# Patient Record
Sex: Male | Born: 1960
Health system: Southern US, Community
[De-identification: ages and names within clinical notes are randomized; demographics above are authoritative.]

## PROBLEM LIST (undated history)

## (undated) DIAGNOSIS — G4733 Obstructive sleep apnea (adult) (pediatric): Secondary | ICD-10-CM

## (undated) DIAGNOSIS — N529 Male erectile dysfunction, unspecified: Secondary | ICD-10-CM

## (undated) DIAGNOSIS — D751 Secondary polycythemia: Secondary | ICD-10-CM

## (undated) DIAGNOSIS — F17200 Nicotine dependence, unspecified, uncomplicated: Secondary | ICD-10-CM

## (undated) DIAGNOSIS — I517 Cardiomegaly: Secondary | ICD-10-CM

## (undated) DIAGNOSIS — Z87442 Personal history of urinary calculi: Secondary | ICD-10-CM

## (undated) DIAGNOSIS — E785 Hyperlipidemia, unspecified: Secondary | ICD-10-CM

## (undated) DIAGNOSIS — I10 Essential (primary) hypertension: Secondary | ICD-10-CM

## (undated) DIAGNOSIS — E669 Obesity, unspecified: Secondary | ICD-10-CM

## (undated) DIAGNOSIS — I219 Acute myocardial infarction, unspecified: Secondary | ICD-10-CM

## (undated) DIAGNOSIS — I371 Nonrheumatic pulmonary valve insufficiency: Secondary | ICD-10-CM

## (undated) DIAGNOSIS — I2510 Atherosclerotic heart disease of native coronary artery without angina pectoris: Secondary | ICD-10-CM

## (undated) DIAGNOSIS — Z9289 Personal history of other medical treatment: Secondary | ICD-10-CM

## (undated) DIAGNOSIS — IMO0001 Reserved for inherently not codable concepts without codable children: Secondary | ICD-10-CM

## (undated) HISTORY — DX: Secondary polycythemia: D75.1

## (undated) HISTORY — DX: Hyperlipidemia, unspecified: E78.5

## (undated) HISTORY — DX: Reserved for inherently not codable concepts without codable children: IMO0001

## (undated) HISTORY — DX: Obesity, unspecified: E66.9

## (undated) HISTORY — DX: Cardiomegaly: I51.7

## (undated) HISTORY — DX: Atherosclerotic heart disease of native coronary artery without angina pectoris: I25.10

## (undated) HISTORY — PX: APPENDECTOMY: SHX54

## (undated) HISTORY — DX: Nonrheumatic pulmonary valve insufficiency: I51.7

## (undated) HISTORY — DX: Nonrheumatic pulmonary valve insufficiency: I37.1

## (undated) HISTORY — DX: Obstructive sleep apnea (adult) (pediatric): G47.33

## (undated) HISTORY — PX: OTHER SURGICAL HISTORY: SHX169

## (undated) HISTORY — DX: Nicotine dependence, unspecified, uncomplicated: F17.200

## (undated) HISTORY — DX: Personal history of other medical treatment: Z92.89

---

## 2007-04-26 HISTORY — PX: PERCUTANEOUS CORONARY STENT INTERVENTION (PCI-S): SHX6016

## 2007-05-09 DIAGNOSIS — I2510 Atherosclerotic heart disease of native coronary artery without angina pectoris: Secondary | ICD-10-CM

## 2007-05-09 DIAGNOSIS — I214 Non-ST elevation (NSTEMI) myocardial infarction: Secondary | ICD-10-CM

## 2007-05-09 HISTORY — PX: CORONARY THROMBECTOMY: CATH118304

## 2007-05-09 HISTORY — PX: CORONARY ANGIOPLASTY WITH STENT PLACEMENT: SHX49

## 2007-05-09 HISTORY — DX: Atherosclerotic heart disease of native coronary artery without angina pectoris: I25.10

## 2007-05-09 HISTORY — DX: Non-ST elevation (NSTEMI) myocardial infarction: I21.4

## 2011-01-03 ENCOUNTER — Encounter: Payer: Self-pay | Admitting: Cardiology

## 2011-01-03 ENCOUNTER — Ambulatory Visit (INDEPENDENT_AMBULATORY_CARE_PROVIDER_SITE_OTHER): Payer: Self-pay | Admitting: Cardiology

## 2011-01-03 DIAGNOSIS — R0989 Other specified symptoms and signs involving the circulatory and respiratory systems: Secondary | ICD-10-CM

## 2011-01-03 DIAGNOSIS — I50810 Right heart failure, unspecified: Secondary | ICD-10-CM

## 2011-01-03 DIAGNOSIS — E785 Hyperlipidemia, unspecified: Secondary | ICD-10-CM

## 2011-01-03 DIAGNOSIS — I2510 Atherosclerotic heart disease of native coronary artery without angina pectoris: Secondary | ICD-10-CM

## 2011-01-03 DIAGNOSIS — R0609 Other forms of dyspnea: Secondary | ICD-10-CM

## 2011-01-03 DIAGNOSIS — F17200 Nicotine dependence, unspecified, uncomplicated: Secondary | ICD-10-CM

## 2011-01-03 DIAGNOSIS — I509 Heart failure, unspecified: Secondary | ICD-10-CM

## 2011-01-03 DIAGNOSIS — G4730 Sleep apnea, unspecified: Secondary | ICD-10-CM

## 2011-01-03 DIAGNOSIS — G4733 Obstructive sleep apnea (adult) (pediatric): Secondary | ICD-10-CM

## 2011-01-03 MED ORDER — BUPROPION HCL ER (SR) 150 MG PO TB12: ORAL_TABLET | ORAL | Status: DC

## 2011-01-03 NOTE — Patient Instructions (Signed)
Lab today---TSH/CBC/BMP/Lipid profile/Liver profile 414.01  786.09  Schedule an appointment for an echocardiogram.  Schedule an appointment with pulmonary for management of your CPAP.  Take Aspirin 81mg  daily. This should be enteric coated.  Take Welbutrin to help you stop smoking. Take one daily for 3 days, then increase to one twice a day. You can take this for a total of 3 months.  Schedule an appointment to see Dr Shirlee Latch in 2-3 weeks.

## 2011-01-04 ENCOUNTER — Telehealth: Payer: Self-pay | Admitting: Cardiology

## 2011-01-04 DIAGNOSIS — G4733 Obstructive sleep apnea (adult) (pediatric): Secondary | ICD-10-CM | POA: Insufficient documentation

## 2011-01-04 DIAGNOSIS — F17200 Nicotine dependence, unspecified, uncomplicated: Secondary | ICD-10-CM | POA: Insufficient documentation

## 2011-01-04 DIAGNOSIS — E785 Hyperlipidemia, unspecified: Secondary | ICD-10-CM | POA: Insufficient documentation

## 2011-01-04 DIAGNOSIS — I50810 Right heart failure, unspecified: Secondary | ICD-10-CM | POA: Insufficient documentation

## 2011-01-04 DIAGNOSIS — I2510 Atherosclerotic heart disease of native coronary artery without angina pectoris: Secondary | ICD-10-CM | POA: Insufficient documentation

## 2011-01-04 NOTE — Progress Notes (Signed)
50 yo with history of CAD, OSA, and obesity presents to establish cardiology care.  He had an MI in 1/09 in [place].  At that time, he had BMS x 2 to the RCA. Last stress myoview was done as part of a DOT physical in [place] in 12/11.  He says he was told that the stress test turned out "all right."  He recently moved to [place].  Prior to this, he lived in [place] and saw a cardiologist there.  He says that 3 years ago, soon after his MI, he had an echo done that showed RV enlargement. He has since then started on CPAP.  He has not had a repeat echo since that time.  Patient is not very active in general, no regular exercise.  He notes dyspnea with steps or walking up an incline.  This has been fairly chronic.  He can walk up to a mile on flat ground. Lately, he has noted increased fatigue.  No chest pain or tightness.  No tachypalpitations.    PMH: 1. Obesity 2. OSA 3. CAD: MI in 1/09 in [place].  Had BMS x 2 to the RCA at that time.  Last stress myoview was in 12/11 in [place] (done for DOT physical) and per the patient was unremarkable.   4. Hyperlipidemia 5. Active smoker 6. Dilated RV on prior echo  SH: Lives in [place], married, works as Naval architect.  Smokes about 1 ppd.   FH: Father with MI in his 59s.   ROS: All systems reviewed and negative except as per HPI.   Current Outpatient Prescriptions  Medication Sig Dispense Refill  . clopidogrel (PLAVIX) 75 MG tablet Take 75 mg by mouth daily.        Marland Kitchen lisinopril (PRINIVIL,ZESTRIL) 10 MG tablet Take 10 mg by mouth daily.        . simvastatin (ZOCOR) 80 MG tablet Take 80 mg by mouth at bedtime.        Marland Kitchen aspirin EC 81 MG tablet Take 1 tablet (81 mg total) by mouth daily.      Marland Kitchen buPROPion (WELLBUTRIN SR) 150 MG 12 hr tablet Take one  daily for 3 days, then increase to one twice a day.  60 tablet  2    BP 134/89  Pulse 80  Resp 18  Ht 5\' 8"  (1.727 m)  Wt 289 lb (131.09 kg)  BMI 43.94 kg/m2 General: NAD, obese Neck: Thick, no  JVD, no thyromegaly or thyroid nodule.  Lungs: Clear to auscultation bilaterally with normal respiratory effort. CV: Nondisplaced PMI.  Heart regular S1/S2, no S3/S4, no murmur.  No peripheral edema.  No carotid bruit.  Normal pedal pulses.  Abdomen: Soft, nontender, no hepatosplenomegaly, no distention.  Skin: Intact without lesions or rashes.  Neurologic: Alert and oriented x 3.  Psych: Normal affect. Extremities: No clubbing or cyanosis.  HEENT: Normal.

## 2011-01-04 NOTE — Assessment & Plan Note (Signed)
Stable with no chest pain. Continue ASA (can decrease to 81 mg daily), Plavix, simvastatin, and lisinopril.  He was on atenolol in the past but it was stopped due to bradycardia.   In the future, will need to consider restarting a beta blocker.

## 2011-01-04 NOTE — Assessment & Plan Note (Addendum)
Taking simvastatin 40 mg daily.  Goal LDL < 70.  Will check lipids/LFTs.

## 2011-01-04 NOTE — Assessment & Plan Note (Signed)
Increased fatigue could be a byproduct of OSA.  Patient feels like CPAP is not working as well. Will refer to sleep medicine for further evaluation.

## 2011-01-04 NOTE — Telephone Encounter (Addendum)
ROI faxed over to [facility]/Dr.Jennifer Debarah Crape @ [phone]   01/04/11/km  Records received from Dr.Jennifer [place] Office have to [place]  01/04/11/km

## 2011-01-04 NOTE — Assessment & Plan Note (Signed)
RV enlargement per patient on prior echo > 3 years ago.  Never had a followup.  Cause certainly could have been pulmonary hypertension in the setting of OSA and smoking, ? COPD.  He has been wearing CPAP for a couple of years now. He is short of breath with exertion.  This could be due to right or left heart failure but also could be due to obesity/deconditioning.  - Echo: assess for RV size/function and PA pressure. - As above, will have him see sleep medicine for reassessment of CPAP.

## 2011-01-04 NOTE — Assessment & Plan Note (Signed)
I counselled him to quit smoking.  He will try wellbutrin as an assist.

## 2011-01-24 ENCOUNTER — Ambulatory Visit: Payer: Self-pay | Admitting: Cardiology

## 2011-01-26 ENCOUNTER — Encounter: Payer: Self-pay | Admitting: Cardiology

## 2011-01-27 ENCOUNTER — Encounter: Payer: Self-pay | Admitting: *Deleted

## 2011-01-28 ENCOUNTER — Encounter: Payer: Self-pay | Admitting: Cardiology

## 2011-01-28 ENCOUNTER — Ambulatory Visit (INDEPENDENT_AMBULATORY_CARE_PROVIDER_SITE_OTHER): Payer: PRIVATE HEALTH INSURANCE | Admitting: Cardiology

## 2011-01-28 ENCOUNTER — Ambulatory Visit (HOSPITAL_COMMUNITY): Payer: PRIVATE HEALTH INSURANCE | Attending: Cardiology | Admitting: Radiology

## 2011-01-28 DIAGNOSIS — E785 Hyperlipidemia, unspecified: Secondary | ICD-10-CM | POA: Insufficient documentation

## 2011-01-28 DIAGNOSIS — R0609 Other forms of dyspnea: Secondary | ICD-10-CM

## 2011-01-28 DIAGNOSIS — R0989 Other specified symptoms and signs involving the circulatory and respiratory systems: Secondary | ICD-10-CM

## 2011-01-28 DIAGNOSIS — F17200 Nicotine dependence, unspecified, uncomplicated: Secondary | ICD-10-CM | POA: Insufficient documentation

## 2011-01-28 DIAGNOSIS — I509 Heart failure, unspecified: Secondary | ICD-10-CM

## 2011-01-28 DIAGNOSIS — I2510 Atherosclerotic heart disease of native coronary artery without angina pectoris: Secondary | ICD-10-CM

## 2011-01-28 DIAGNOSIS — I50810 Right heart failure, unspecified: Secondary | ICD-10-CM

## 2011-01-28 DIAGNOSIS — I252 Old myocardial infarction: Secondary | ICD-10-CM | POA: Insufficient documentation

## 2011-01-28 MED ORDER — BISOPROLOL FUMARATE 5 MG PO TABS: 2.5000 mg | ORAL_TABLET | Freq: Every day | ORAL | Status: DC

## 2011-01-28 MED ORDER — SIMVASTATIN 40 MG PO TABS: 40.0000 mg | ORAL_TABLET | Freq: Every day | ORAL | Status: DC

## 2011-01-28 NOTE — Patient Instructions (Addendum)
Your physician wants you to follow-up in:6 months. You will receive a reminder letter in the mail two months in advance. If you don't receive a letter, please call our office to schedule the follow-up appointment.   Your physician has recommended you make the following change in your medication:   Add zebata 2.5 mg, daily  PLEASE SEND CHOLESTEROL RESULTS TO DR MCLEAN.  Call sleep study for repair of cpap machine at [phone]

## 2011-01-30 NOTE — Progress Notes (Signed)
50 yo with history of CAD, OSA, and obesity presents for cardiology followup.  He had an MI in 1/09 in [place].  At that time, he had BMS x 2 to the RCA. Last stress myoview was done as part of a DOT physical in [place] in 12/11.  He says he was told that the stress test turned out "all right."  He recently moved to [place].  Prior to this, he lived in [place] and saw a cardiologist there.  He says that 3 years ago, soon after his MI, he had an echo done that showed RV enlargement. He has since then started on CPAP.  He has not had a repeat echo since that time.  Patient is not very active in general, no regular exercise.  He notes dyspnea with steps or walking up an incline.  This has been fairly chronic.  He can walk up to a mile on flat ground. Lately, he has noted increased fatigue.  No chest pain or tightness.  No tachypalpitations.  He continues to smoke.    I had him get an echo today.  This showed EF 55%, basal to mid inferior hypokinesis, grade II diastolic dysfunction, normal RV, and no evidence for pulmonary hypertension.   PMH: 1. Obesity 2. OSA 3. CAD: MI in 1/09 in [place].  Had BMS x 2 to the RCA at that time.  Last stress myoview was in 12/11 in [place] (done for DOT physical) and per the patient was unremarkable.   4. Hyperlipidemia 5. Active smoker 6. Dilated RV on 2009 echo.  However, echo (10/12) showed EF 55%, basal to mid inferior hypokinesis, grade II diastolic dysfunction, normal RV size and systolic function, PA systolic pressure   SH: Lives in [place], married, works as Naval architect.  Smokes about 1 ppd.   FH: Father with MI in his 16s.   ROS: All systems reviewed and negative except as per HPI.   Current Outpatient Prescriptions  Medication Sig Dispense Refill  . aspirin EC 81 MG tablet Take 1 tablet (81 mg total) by mouth daily.      Marland Kitchen buPROPion (WELLBUTRIN SR) 150 MG 12 hr tablet Take one  daily for 3 days, then increase to one twice a day.  60 tablet  2  .  clopidogrel (PLAVIX) 75 MG tablet Take 75 mg by mouth daily.        Marland Kitchen lisinopril (PRINIVIL,ZESTRIL) 10 MG tablet Take 10 mg by mouth daily.        . simvastatin (ZOCOR) 40 MG tablet Take 1 tablet (40 mg total) by mouth at bedtime.  30 tablet  1  . bisoprolol (ZEBETA) 5 MG tablet Take 0.5 tablets (2.5 mg total) by mouth daily.  15 tablet  5    BP 132/82  Pulse 76  Ht 5\' 7"  (1.702 m)  Wt 292 lb (132.45 kg)  BMI 45.73 kg/m2 General: NAD, obese Neck: Thick, no JVD, no thyromegaly or thyroid nodule.  Lungs: Clear to auscultation bilaterally with normal respiratory effort. CV: Nondisplaced PMI.  Heart regular S1/S2, no S3/S4, no murmur.  No peripheral edema.  No carotid bruit.  Normal pedal pulses.  Abdomen: Soft, nontender, no hepatosplenomegaly, no distention.  Skin: Intact without lesions or rashes.  Neurologic: Alert and oriented x 3.  Psych: Normal affect. Extremities: No clubbing or cyanosis.  HEENT: Normal.

## 2011-01-30 NOTE — Assessment & Plan Note (Signed)
Patient had recent lipids/LFTs as part of an insurance physical.  He will send [place] the labs.

## 2011-01-30 NOTE — Assessment & Plan Note (Signed)
Stable with no chest pain. Continue ASA 81, Plavix, simvastatin, and lisinopril.  He was on atenolol in the past but it was stopped due to bradycardia.   I am going to restart a low dose beta blocker today, bisoprolol 2.5 mg daily.  I will use this rather than metoprolol give possible COPD.

## 2011-01-30 NOTE — Assessment & Plan Note (Signed)
The patient's RV was dilated and hypokinetic on report from outside hospital in 2009.  However, echo today appeared to show a normal RV.  The improvement may be due to CPAP use.  He has been having trouble with his CPAP lately.  I will refer him for a sleep medicine evaluation.

## 2011-01-30 NOTE — Assessment & Plan Note (Signed)
I counselled him to quit smoking.  He will try wellbutrin as an assist (has not filled prescription yet.

## 2011-02-17 ENCOUNTER — Telehealth: Payer: Self-pay | Admitting: *Deleted

## 2011-02-17 DIAGNOSIS — E785 Hyperlipidemia, unspecified: Secondary | ICD-10-CM

## 2011-02-17 DIAGNOSIS — I2510 Atherosclerotic heart disease of native coronary artery without angina pectoris: Secondary | ICD-10-CM

## 2011-02-17 MED ORDER — ATORVASTATIN CALCIUM 40 MG PO TABS: 40.0000 mg | ORAL_TABLET | Freq: Every day | ORAL | Status: DC

## 2011-02-17 NOTE — Telephone Encounter (Signed)
I talked with pt. He will stop simvastatin and start atorvastatin 40mg  daily. I will mail pt a order for a fasting lipid/liver profile to be done in 2 months.

## 2011-02-17 NOTE — Telephone Encounter (Signed)
Raymond Humphrey,  Mr Raymond Humphrey's LDL is too high. Needs to change to atorvastatin 40 with repeat lipids/LFTs in 2 months.

## 2011-03-09 ENCOUNTER — Encounter: Payer: Self-pay | Admitting: Cardiology

## 2012-02-06 ENCOUNTER — Encounter: Payer: Self-pay | Admitting: Internal Medicine

## 2012-02-06 ENCOUNTER — Ambulatory Visit (INDEPENDENT_AMBULATORY_CARE_PROVIDER_SITE_OTHER): Payer: BC Managed Care – PPO | Admitting: Internal Medicine

## 2012-02-06 VITALS — BP 126/80 | HR 92 | Ht 68.0 in | Wt 303.4 lb

## 2012-02-06 DIAGNOSIS — F17200 Nicotine dependence, unspecified, uncomplicated: Secondary | ICD-10-CM

## 2012-02-06 DIAGNOSIS — G4733 Obstructive sleep apnea (adult) (pediatric): Secondary | ICD-10-CM

## 2012-02-06 MED ORDER — DOXYCYCLINE HYCLATE 100 MG PO TABS: ORAL_TABLET | ORAL | Status: AC

## 2012-02-06 NOTE — Progress Notes (Signed)
02/06/12- 51 yoM smoker self referred for sleep evaluation, complicated by hx CAD/ stents, R ventricular failure, tobacco use. Self referral-Had [facility] through [place], [place]; had sleep study about 6 years ago in [place]. 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 He finds he cannot sleep without CPAP, 9 CWP. He is no longer followed by his pulmonary physician in [place]. He feels rested and is told his does not snore through his CPAP mask. Bedtime 10 PM estimating sleep latency 30 minutes and waking 3 times before up at 6 AM. He has lost 25 pounds in the last 2 years. In the past 5 days he has had acute nasal congestion with some bloody nasal discharge and mild frontal headache. There is no history ENT surgery. He is aware of deviated septum. Medical history of myocardial infarction. History of TB exposure with details not available. He had smoked as much as 2-1/2 packs per day but is now down to one half pack per day and he drinks a sixpack of beer per week when at home. He is married, working as a Warehouse manager. An uncle has sleep apnea.  Prior to Admission medications   Medication Sig Start Date End Date Taking? Authorizing Provider  aspirin EC 81 MG tablet Take 1 tablet (81 mg total) by mouth daily. 02/07/12   Laurey Morale, MD  atorvastatin (LIPITOR) 40 MG tablet Take 1 tablet (40 mg total) by mouth daily. 02/07/12 02/06/13  Laurey Morale, MD  buPROPion (WELLBUTRIN SR) 150 MG 12 hr tablet Take one  daily for 3 days, then increase to one twice a day. 02/07/12   Laurey Morale, MD  doxycycline (VIBRA-TABS) 100 MG tablet 2 today then one daily 02/06/12 02/05/13  Waymon Budge, MD  lisinopril (PRINIVIL,ZESTRIL) 10 MG tablet Take 1 tablet (10 mg total) by mouth daily. 02/07/12   Laurey Morale, MD   Past Medical History  Diagnosis Date  . RVF (right ventricular failure)   . Hyperlipidemia   . Coronary artery disease   . Smoking   . OSA (obstructive  sleep apnea)    Past Surgical History  Procedure Date  . Angioplasty   . Appendectomy    History reviewed. No pertinent family history. History   Social History  . Marital Status: Married    Spouse Name: N/A    Number of Children: N/A  . Years of Education: N/A   Occupational History  . Truck Driver [employer]   Social History Main Topics  . Smoking status: Current Every Day Smoker -- 0.5 packs/day    Types: Cigarettes  . Smokeless tobacco: Not on file  . Alcohol Use: Not on file     6pack a week when home  . Drug Use: No  . Sexually Active: Not on file   Other Topics Concern  . Not on file   Social History Narrative  . No narrative on file   ROS-see HPI Constitutional:   No-   weight loss, night sweats, fevers, chills, fatigue, lassitude. HEENT:   No-  headaches, difficulty swallowing, tooth/dental problems, sore throat,       No-  sneezing, itching, ear ache, nasal congestion, post nasal drip,  CV:  No-   chest pain, orthopnea, PND, swelling in lower extremities, anasarca,  dizziness, palpitations Resp: No-   shortness of breath with exertion or at rest.              No-  productive cough,  No non-productive cough,  No- coughing up of blood.              No-   change in color of mucus.  No- wheezing.   Skin: No-   rash or lesions. GI:  No-   heartburn, indigestion, abdominal pain, nausea, vomiting, diarrhea,                 change in bowel habits, loss of appetite GU: No-   dysuria, change in color of urine, no urgency or frequency.  No- flank pain. MS:  No-   joint pain or swelling.  No- decreased range of motion.  No- back pain. Neuro-     nothing unusual Psych:  No- change in mood or affect. No depression or anxiety.  No memory loss.  OBJ- Physical Exam General- Alert, Oriented, Affect-appropriate, Distress- none acute, + obese Skin- rash-none, lesions- none, excoriation- none Lymphadenopathy- none Head- atraumatic            Eyes- Gross vision intact,  PERRLA, conjunctivae and secretions clear            Ears- Hearing, canals-normal            Nose- + turbinate edema, no-Septal dev, mucus, polyps, erosion, perforation             Throat- Mallampati III , mucosa clear , drainage- none, tonsils- atrophic Neck- flexible , trachea midline, no stridor , thyroid nl, carotid no bruit. Thick neck. Chest - symmetrical excursion , unlabored           Heart/CV- RRR , no murmur , no gallop  , no rub, nl s1 s2                           - JVD- none , edema- none, stasis changes- none, varices- none           Lung- clear to P&A, wheeze- none, cough- none , dullness-none, rub- none           Chest wall-  Abd- tender-no, distended-no, bowel sounds-present, HSM- no Br/ Gen/ Rectal- Not done, not indicated Extrem- cyanosis- none, clubbing, none, atrophy- none, strength- nl Neuro- grossly intact to observation

## 2012-02-06 NOTE — Patient Instructions (Addendum)
Script for doxycycline sent  Consider trying the saline nasal rinse to clear congestion- squeeze bottle or Neti pot  We  Can continue your CPAP at 9 cwp/ [facility]/DME

## 2012-02-07 ENCOUNTER — Ambulatory Visit (INDEPENDENT_AMBULATORY_CARE_PROVIDER_SITE_OTHER): Payer: BC Managed Care – PPO | Admitting: Cardiology

## 2012-02-07 ENCOUNTER — Encounter: Payer: Self-pay | Admitting: Cardiology

## 2012-02-07 ENCOUNTER — Ambulatory Visit (HOSPITAL_COMMUNITY): Payer: BC Managed Care – PPO | Attending: Cardiovascular Disease | Admitting: Radiology

## 2012-02-07 VITALS — BP 124/87 | HR 84 | Ht 68.0 in | Wt 302.0 lb

## 2012-02-07 VITALS — Ht 68.0 in | Wt 304.0 lb

## 2012-02-07 DIAGNOSIS — E785 Hyperlipidemia, unspecified: Secondary | ICD-10-CM

## 2012-02-07 DIAGNOSIS — R739 Hyperglycemia, unspecified: Secondary | ICD-10-CM

## 2012-02-07 DIAGNOSIS — R0609 Other forms of dyspnea: Secondary | ICD-10-CM

## 2012-02-07 DIAGNOSIS — R0989 Other specified symptoms and signs involving the circulatory and respiratory systems: Secondary | ICD-10-CM

## 2012-02-07 DIAGNOSIS — R5381 Other malaise: Secondary | ICD-10-CM | POA: Insufficient documentation

## 2012-02-07 DIAGNOSIS — R5383 Other fatigue: Secondary | ICD-10-CM | POA: Insufficient documentation

## 2012-02-07 DIAGNOSIS — I219 Acute myocardial infarction, unspecified: Secondary | ICD-10-CM | POA: Insufficient documentation

## 2012-02-07 DIAGNOSIS — I2510 Atherosclerotic heart disease of native coronary artery without angina pectoris: Secondary | ICD-10-CM

## 2012-02-07 DIAGNOSIS — R7309 Other abnormal glucose: Secondary | ICD-10-CM

## 2012-02-07 DIAGNOSIS — R002 Palpitations: Secondary | ICD-10-CM | POA: Insufficient documentation

## 2012-02-07 DIAGNOSIS — F17200 Nicotine dependence, unspecified, uncomplicated: Secondary | ICD-10-CM

## 2012-02-07 MED ORDER — TECHNETIUM TC 99M SESTAMIBI GENERIC - CARDIOLITE
33.0000 | Freq: Once | INTRAVENOUS | Status: AC | PRN
  Administered 2012-02-07: 33 via INTRAVENOUS

## 2012-02-07 MED ORDER — ATORVASTATIN CALCIUM 40 MG PO TABS: 40.0000 mg | ORAL_TABLET | Freq: Every day | ORAL | Status: DC

## 2012-02-07 MED ORDER — LISINOPRIL 10 MG PO TABS: 10.0000 mg | ORAL_TABLET | Freq: Every day | ORAL | Status: DC

## 2012-02-07 MED ORDER — BUPROPION HCL ER (SR) 150 MG PO TB12: ORAL_TABLET | ORAL | Status: DC

## 2012-02-07 NOTE — Progress Notes (Signed)
Patient ID: Raymond Humphrey, male   DOB: 06/27/1960, 51 y.o.   MRN: [id number] 51 yo with history of CAD, OSA, and obesity presents for cardiology followup.  He had an MI in 1/09 in [place].  At that time, he had BMS x 2 to the RCA. Last stress myoview was done as part of a DOT physical in [place] in 12/11.  He says he was told that the stress test turned out "all right."  Echo in our office in 2013 showed EF 55%, basal to mid inferior hypokinesis, grade II diastolic dysfunction, normal RV, and no evidence for pulmonary hypertension.   Since I last saw Raymond Humphrey, he has gained about 10 lbs.  He is eating poorly.  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  He is smoking but has cut back some.  Now he is smoking about a pack a week.  The only medication that he is taking regularly now is aspirin.  He takes his other meds sporadically if at all.  He is not as short of breath with exertion since cutting back on cigarettes, though he does get short of breath if he walks fast up a flight of steps.  Lately, he has been getting pain down his left arm.  This is not clearly exertional and does not have a definite trigger.  However, he is concerned because he had this type of pain (and no chest pain) with his MI.   Labs (10/12): K 4.3, creatinine 1.03, LFTs normal, LDL 132, HDL 53  ECG: NSR, nonspecific T wave inversions inferiorly  PMH: 1. Obesity 2. OSA: on CPAP.  3. CAD: MI in 1/09 in [place].  Had BMS x 2 to the RCA at that time.  Last stress myoview was in 12/11 in [place] (done for DOT physical) and per the patient was unremarkable.   4. Hyperlipidemia 5. Active smoker 6. Dilated RV on 2009 echo.  However, echo (10/12) showed EF 55%, basal to mid inferior hypokinesis, grade II diastolic dysfunction, normal RV size and systolic function, PA systolic pressure   SH: Lives in [place], married, works as Naval architect.  Smokes about 1 ppd.   FH: Father with MI in his 62s.     ROS: All systems reviewed and negative except as per HPI.   Current Outpatient Prescriptions  Medication Sig Dispense Refill  . aspirin EC 81 MG tablet Take 1 tablet (81 mg total) by mouth daily.      Marland Kitchen atorvastatin (LIPITOR) 40 MG tablet Take 1 tablet (40 mg total) by mouth daily.  30 tablet  6  . buPROPion (WELLBUTRIN SR) 150 MG 12 hr tablet Take one  daily for 3 days, then increase to one twice a day.  60 tablet  2  . doxycycline (VIBRA-TABS) 100 MG tablet 2 today then one daily  8 tablet  0  . lisinopril (PRINIVIL,ZESTRIL) 10 MG tablet Take 1 tablet (10 mg total) by mouth daily.  30 tablet  6  . DISCONTD: aspirin EC 81 MG tablet Take 1 tablet (81 mg total) by mouth daily.      Marland Kitchen DISCONTD: atorvastatin (LIPITOR) 40 MG tablet Take 1 tablet (40 mg total) by mouth daily.  30 tablet  6  . DISCONTD: buPROPion (WELLBUTRIN SR) 150 MG 12 hr tablet Take one  daily for 3 days, then increase to one twice a day.  60 tablet  2  . DISCONTD: lisinopril (PRINIVIL,ZESTRIL) 10 MG tablet Take 10  mg by mouth daily.          BP 124/87  Pulse 84  Ht 5\' 8"  (1.727 m)  Wt 302 lb (136.986 kg)  BMI 45.92 kg/m2 General: NAD, obese Neck: Thick, no JVD, no thyromegaly or thyroid nodule.  Lungs: Clear to auscultation bilaterally with normal respiratory effort. CV: Nondisplaced PMI.  Heart regular S1/S2, no S3/S4, no murmur.  No peripheral edema.  No carotid bruit.  Normal pedal pulses.  Abdomen: Soft, nontender, no hepatosplenomegaly, no distention.  Neurologic: Alert and oriented x 3.  Psych: Normal affect. Extremities: No clubbing or cyanosis.   Assessment/Plan:  1. CAD: Patient is having left arm pain reminiscent of the pain with prior MI.  It is not clearly exertional, no definite pattern.  Unfortunately, he still smokes and has not been taking any of his medications regularly other than aspirin.  - I will arrange for ETT-myoview to assess for ischemia.  - He needs to continue ASA 81 mg daily.  He  should restart atorvastatin 40 mg daily and lisinopril 10 mg daily.  I will simplify his regimen by not having him restart Plavix and bisoprolol for now.  2. Hyperlipidemia: He is fasting today, will check lipids.  As above, I am going to have him restart atorvastatin and will recheck levels in 2 months along with LFTs.  3. Smoking: I strongly encouraged him to quit smoking.  He has cut back.  He will restart wellbutrin and will continue using an electronic cigarette.  4. Obesity: He has a very bad diet, eats fast food a lot when driving.  I encouraged him to pack meals to avoid eating out.  He needs to cut back on meats and processed foods and increase fruits/vegetables.  If Myoview looks ok, I want him to start exercising: work up to 30 minutes aerobic exercise like walking 5-6 days a week.  I am also going to check hemoglobin A1c.   Marca Ancona 02/07/2012 12:46 PM

## 2012-02-07 NOTE — Patient Instructions (Addendum)
Take aspirin 81mg  daily. Take this with food.   Take lisinopril 10mg  daily.  Take atorvastatin 40mg  daily.  Use welbutrin 150mg   to help you stop smoking. Take 1  daily for 3 days, then increase to 1 two times a day. Stay on this dose. You can take this for a total of 3 months.   Your physician recommends that you have  lab work today--BMET/Lipid/Liver /HGB A1c/ CBCd.   Your physician has requested that you have en exercise stress myoview. For further information please visit [email or web address] Please follow instruction sheet, as given.  Your physician wants you to follow-up in: 6 months with Dr Shirlee Latch. (April 2014). You will receive a reminder letter in the mail two months in advance. If you don't receive a letter, please call our office to schedule the follow-up appointment.

## 2012-02-12 ENCOUNTER — Encounter: Payer: Self-pay | Admitting: Internal Medicine

## 2012-02-12 NOTE — Assessment & Plan Note (Signed)
Smoking cessation was emphasized and available support reviewed.

## 2012-02-12 NOTE — Assessment & Plan Note (Signed)
He describes very good compliance and control. We discussed maintenance of equipment, how to tell if pressure needs changing, medical goals.

## 2012-02-13 ENCOUNTER — Ambulatory Visit (HOSPITAL_COMMUNITY): Payer: BC Managed Care – PPO | Attending: Internal Medicine

## 2012-02-13 DIAGNOSIS — R079 Chest pain, unspecified: Secondary | ICD-10-CM

## 2012-02-13 DIAGNOSIS — I2510 Atherosclerotic heart disease of native coronary artery without angina pectoris: Secondary | ICD-10-CM

## 2012-02-13 DIAGNOSIS — R0602 Shortness of breath: Secondary | ICD-10-CM

## 2012-02-13 MED ORDER — TECHNETIUM TC 99M SESTAMIBI GENERIC - CARDIOLITE
33.0000 | Freq: Once | INTRAVENOUS | Status: AC | PRN
  Administered 2012-02-13: 33 via INTRAVENOUS

## 2012-02-13 NOTE — Progress Notes (Addendum)
[facility] SITE 3 NUCLEAR MED [address] [id number] [place] [address] [phone]  Cardiology Nuclear Med Study  Raymond Humphrey is a 51 y.o. male     MRN : [id number]     DOB: 1960-07-03  Procedure Date: 02/13/2012  Nuclear Med Background Indication for Stress Test:  Evaluation for Ischemia, Stent Patency and Abnormal EKG History:  '09 myocardial infarction> Cath ([place])EF=55%> Stent RCA, 03/2010 Myocardial Perfusion Study-Normal per patient [facility]), EF=65%, 01/2011 Echo: EF=55% Cardiac Risk Factors: Family History - CAD, Lipids, Obesity and Smoker  Symptoms: Chest Pain with/without exertion (last occurrence several months ago), (L) upper arm pain similar to pain prior to MI (2 weeks ago),  DOE, Fatigue with Exertion, Palpitations and Rapid HR   Nuclear Pre-Procedure Caffeine/Decaff Intake:  None > 12 hrs NPO After: 11:30pm   Lungs:  clear O2 Sat: 95% on room air. IV 0.9% NS with Angio Cath:  22g  IV Site: R Wrist x 1, tolerated well IV Started by:  Irean Hong, RN  Chest Size (in):  56 Cup Size: n/a  Height: 5\' 8"  (1.727 m)  Weight:  304 lb (137.893 kg)  BMI:  Body mass index is 46.22 kg/(m^2). Tech Comments:  Took lisinopril last night    Nuclear Med Study 1 or 2 day study: 2 day  Stress Test Type:  Stress  Reading MD: Dietrich Pates, MD  Order Authorizing Provider:  Marca Ancona, MD  Resting Radionuclide: Technetium 54m Sestamibi  Resting Radionuclide Dose: 33.0 mCi    On     02-07-12  Stress Radionuclide:  Technetium 63m Sestamibi  Stress Radionuclide Dose: 33.0 mCi   On       02-13-12           Stress Protocol Rest HR: 82 Stress HR: 150  Rest BP: 127/69 Stress BP: 201/89  Exercise Time (min): 7:00 METS: 8.2   Predicted Max HR: 170 bpm % Max HR: 88.24 bpm Rate Pressure Product: 63875   Dose of Adenosine (mg):  n/a Dose of Lexiscan: n/a mg  Dose of Atropine (mg): n/a Dose of Dobutamine: n/a mcg/kg/min (at max HR)  Stress  Test Technologist: Irean Hong, RN  Nuclear Technologist:  Domenic Polite, CNMT     Rest Procedure:  Myocardial perfusion imaging was performed at rest 45 minutes following the intravenous administration of Technetium 71m Sestamibi. Rest ECG: NSR - Normal EKG  Stress Procedure:  The patient performed treadmill exercise using a Bruce  Protocol for 7 minutes, RPE=17 minutes. The patient stopped due to DOE and denied any chest pain.  There were nonspecific ST-T wave changes. There was a hypertensive response to exercise. There was a rare PVC.Technetium 93m Sestamibi was injected at peak exercise and myocardial perfusion imaging was performed after a brief delay. Stress ECG: No significant change from baseline ECG  QPS Raw Data Images:  Images were motion corrected.  Extensive soft tissue (diaphragm, bowel acitvity, subcutaneous fat) surround heart. Stress Images:  Moderate defect in the inferior wall (base, mid)  Otherwise normal perfusion. Rest Images:  Comparison with the stress images reveals no significant change. Subtraction (SDS):  No signif ischemia inferior. Transient Ischemic Dilatation (Normal <1.22):  1.35 Lung/Heart Ratio (Normal <0.45):  0.43  Quantitative Gated Spect Images QGS EDV:  138 ml QGS ESV:  61 ml  Impression Exercise Capacity:  Fair exercise capacity. BP Response:  Normal blood pressure response. Clinical Symptoms:  No chest pain. ECG Impression:  No significant ST segment change suggestive  of ischemia. Comparison with Prior Nuclear Study:  Per patient previous study was normal. Overall Impression:  Probable normal perfusion and soft tissue attenuation (diaphragm)  No significant ischemia.  LV Ejection Fraction: 56%.  LV Wall Motion:  Normal thickening  Dietrich Pates  Probably normal study. No ischemia.  EF normal.   Marca Ancona 02/14/2012

## 2012-02-14 ENCOUNTER — Telehealth: Payer: Self-pay | Admitting: Cardiology

## 2012-02-14 NOTE — Telephone Encounter (Signed)
Pt calling re results of stress test yesterday

## 2012-02-14 NOTE — Progress Notes (Signed)
Pt.notified

## 2012-02-14 NOTE — Telephone Encounter (Signed)
Spoke with pt about myoview results 

## 2012-04-19 ENCOUNTER — Telehealth: Payer: Self-pay | Admitting: Internal Medicine

## 2012-04-19 NOTE — Telephone Encounter (Signed)
Pt returned call.  Holly D Pryor ° °

## 2012-04-19 NOTE — Telephone Encounter (Signed)
ATC pt no answer, LMOMTCB 

## 2012-04-19 NOTE — Telephone Encounter (Signed)
Pt aware that letter at front for pick up.

## 2012-04-19 NOTE — Telephone Encounter (Signed)
Per CY: ok to write letter >>  To Raymond Humphrey To Whom It May Concern  Raymond Humphrey is followed here for obstructive sleep apnea.  Our information is that he is very compliant with CPAP and that he is very compliant with CPAP and gets good control, sleeping well.  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 ==================================== Letter generated and placed on CY's cart to be signed.  Spoke with patient who will pick this up tomorrow 12.27.13 or Monday 12.30.13.

## 2012-04-19 NOTE — Telephone Encounter (Signed)
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  Dr. Maple Humphrey please advise if this letter can be done. Thank you   If note can be done, patient needs a phone call back to let him know once letter is done to pick up.

## 2012-05-11 ENCOUNTER — Encounter: Payer: Self-pay | Admitting: Cardiology

## 2012-10-19 ENCOUNTER — Emergency Department: Payer: Self-pay | Admitting: Emergency Medicine

## 2013-02-05 ENCOUNTER — Ambulatory Visit: Payer: BC Managed Care – PPO | Admitting: Internal Medicine

## 2013-03-22 ENCOUNTER — Encounter: Payer: Self-pay | Admitting: Internal Medicine

## 2013-03-22 ENCOUNTER — Ambulatory Visit (INDEPENDENT_AMBULATORY_CARE_PROVIDER_SITE_OTHER): Payer: BC Managed Care – PPO | Admitting: Internal Medicine

## 2013-03-22 VITALS — BP 128/84 | HR 80 | Ht 68.0 in | Wt 280.0 lb

## 2013-03-22 DIAGNOSIS — G4733 Obstructive sleep apnea (adult) (pediatric): Secondary | ICD-10-CM

## 2013-03-22 DIAGNOSIS — F17200 Nicotine dependence, unspecified, uncomplicated: Secondary | ICD-10-CM

## 2013-03-22 DIAGNOSIS — Z720 Tobacco use: Secondary | ICD-10-CM

## 2013-03-22 NOTE — Progress Notes (Signed)
02/06/12- 51 yoM smoker self referred for sleep evaluation, complicated by hx CAD/ stents, R ventricular failure, tobacco use. Self referral-Had [facility] through [place], [place]; had sleep study about 6 years ago in [place]. 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 He finds he cannot sleep without CPAP, 9 CWP. He is no longer followed by his pulmonary physician in [place]. He feels rested and is told his does not snore through his CPAP mask. Bedtime 10 PM estimating sleep latency 30 minutes and waking 3 times before up at 6 AM. He has lost 25 pounds in the last 2 years. In the past 5 days he has had acute nasal congestion with some bloody nasal discharge and mild frontal headache. There is no history ENT surgery. He is aware of deviated septum. Medical history of myocardial infarction. History of TB exposure with details not available. He had smoked as much as 2-1/2 packs per day but is now down to one half pack per day and he drinks a sixpack of beer per week when at home. He is married, working as a Warehouse manager. An uncle has sleep apnea.  03/22/13- 52 yoM smoker self referred for sleep evaluation, complicated by hx CAD/ stents, R ventricular failure, tobacco use FOLLOWS FOR:  Wearing CPAP 6-8 hours night.  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 He has been known to gym and has lost 25 pounds. Declines flu vaccine. Still smokes against advice.  ROS-see HPI Constitutional:   No-   weight loss, night sweats, fevers, chills, fatigue, lassitude. HEENT:   No-  headaches, difficulty swallowing, tooth/dental problems, sore throat,       No-  sneezing, itching, ear ache, nasal congestion, post nasal drip,  CV:  No-   chest pain, orthopnea, PND, swelling in lower extremities, anasarca,  dizziness, palpitations Resp: No-   shortness of breath with exertion or at  rest.              No-   productive cough,  No non-productive cough,  No- coughing up of blood.              No-   change in color of mucus.  No- wheezing.   Skin: No-   rash or lesions. GI:  No-   heartburn, indigestion, abdominal pain, nausea, vomiting, GU:  MS:  No-   joint pain or swelling. . Neuro-     nothing unusual Psych:  No- change in mood or affect. No depression or anxiety.  No memory loss.  OBJ- Physical Exam General- Alert, Oriented, Affect-appropriate, Distress- none acute, + obese Skin- rash-none, lesions- none, excoriation- none Lymphadenopathy- none Head- atraumatic            Eyes- Gross vision intact, PERRLA, conjunctivae and secretions clear            Ears- Hearing, canals-normal            Nose- + turbinate edema, no-Septal dev, mucus, polyps, erosion, perforation             Throat- Mallampati III , mucosa clear , drainage- none, tonsils- atrophic Neck- flexible , trachea midline, no stridor , thyroid nl, carotid no bruit. Thick neck. Chest - symmetrical excursion , unlabored           Heart/CV- RRR , no murmur , no gallop  , no rub, nl  s1 s2                           - JVD- none , edema- none, stasis changes- none, varices- none           Lung- clear to P&A, wheeze+ trace, cough- none , dullness-none, rub- none           Chest wall-  Abd- tender-no, distended-no, bowel sounds-present, HSM- no Br/ Gen/ Rectal- Not done, not indicated Extrem- cyanosis- none, clubbing, none, atrophy- none, strength- nl Neuro- grossly intact to observation

## 2013-03-22 NOTE — Patient Instructions (Addendum)
We can continue CPAP 9/ [employer]. If you begin to have problems, let us know as discussed and we can consider an autotitration to reassess pressure needs.  The weight loss is great and will help you in several ways.  It is important to get serious about stopping smoking before smoking stops you.  Order- schedule PFT    Dx tobacco user  Letter given for DOT

## 2013-04-10 NOTE — Assessment & Plan Note (Signed)
Documented good compliance and reported good control. We discussed his weight loss which will make a big difference as it continues. If his mask begins to leak or if he feels ar pressure is too high, we can auto titrate

## 2014-05-06 ENCOUNTER — Telehealth: Payer: Self-pay | Admitting: Cardiology

## 2014-05-06 NOTE — Telephone Encounter (Signed)
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  He states he is not coming here for the physical portion.  He is aware Richardson Dopp nor Dr Aundra Dubin are certified to fill out physical portion.  He will discuss this with Nicki Reaper when he comes in for the appt 2/1

## 2014-05-06 NOTE — Telephone Encounter (Signed)
New Msg      1. Are you calling in reference to your FMLA or disability form? Form for DOT needs to be completed for employment  2. What is your question in regards to FMLA or disability form? Can Richardson Dopp PA complete these forms at appt on 05/26/14 or is Dr. Aundra Dubin the only one who can complete this?   3. Do you need copies of your medical records? No  4. Are you waiting on a nurse to call you back with results or are you wanting copies of your results? No  Pt needs paperwork to be completed stating that he is healthy enough to drive due to past history of heart attack.   Please call pt.

## 2014-05-26 ENCOUNTER — Encounter: Payer: Self-pay | Admitting: Physician Assistant

## 2014-05-26 ENCOUNTER — Ambulatory Visit (INDEPENDENT_AMBULATORY_CARE_PROVIDER_SITE_OTHER): Payer: Self-pay | Admitting: Physician Assistant

## 2014-05-26 VITALS — BP 117/70 | HR 90 | Ht 68.0 in | Wt 302.0 lb

## 2014-05-26 DIAGNOSIS — G4733 Obstructive sleep apnea (adult) (pediatric): Secondary | ICD-10-CM

## 2014-05-26 DIAGNOSIS — F17210 Nicotine dependence, cigarettes, uncomplicated: Secondary | ICD-10-CM

## 2014-05-26 DIAGNOSIS — I2510 Atherosclerotic heart disease of native coronary artery without angina pectoris: Secondary | ICD-10-CM

## 2014-05-26 DIAGNOSIS — E785 Hyperlipidemia, unspecified: Secondary | ICD-10-CM

## 2014-05-26 NOTE — Patient Instructions (Signed)
Your physician wants you to follow-up in: [place]. Aundra Dubin. You will receive a reminder letter in the mail two months in advance. If you don't receive a letter, please call our office to schedule the follow-up appointment.   Your physician has requested that you have an exercise tolerance test. For further information please visit [email or web address]. Please also follow instruction sheet, as given.  Your physician recommends that you return for lab work in: FASTING LIPID AND LIVER PANEL, BMET CAN BE DONE THE SAME DAY AS STRESS TEST

## 2014-05-26 NOTE — Progress Notes (Signed)
Cardiology Office Note   Date:  05/26/2014   ID:  Raymond Humphrey, DOB 23-Feb-1961, MRN [id number]  PCP:  No PCP Per Patient  Cardiologist:  Dr. Loralie Champagne     Chief Complaint  Patient presents with  . Coronary Artery Disease    Follow Up; Needs ETT for DOT physical     History of Present Illness: Raymond Humphrey is a 54 y.o. male who presents for FU on the above.   He has a history of CAD, OSA, and obesity. He had an MI in 1/09 in [place]. At that time, he had BMS x 2 to the RCA.  Echo in our office in 2013 showed EF 55%, basal to mid inferior hypokinesis, grade II diastolic dysfunction, normal RV, and no evidence for pulmonary hypertension. Myoview in 2013 in our office demonstrated no ischemia.    Last seen by Dr. Loralie Champagne in 01/2012.  He had gained about 10 lbs.and was eating poorly. The only medication that he was taking regularly was aspirin. He took his other meds sporadically if at all.   He has been doing well since last seen.  He denies chest pain or significant dyspnea.  He is NYHA 2-2b.  He denies orthopnea, edema.  He sleeps with CPAP.  He denies syncope or near syncope.     Studies/Reports Reviewed Today:   - LHC (1/09):  OM 30%, RCA occluded >> BMS x 2 to RCA (done in [place])  - Echocardiogram (01/28/11):  EF 55%, inf mild HK, Gr 2 DD, mild LAE, mild RAE  - Nuclear Stress Test (01/2012):  diaph atten, no ischemia, EF 56%   PMH: 1. Obesity 2. OSA: on CPAP.  3. CAD: MI in 1/09 in [place]. Had BMS x 2 to the RCA at that time. 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 Nuclear Stress Test (01/2012):  diaph atten, no ischemia, EF 56%.   4. Hyperlipidemia 5. Active smoker 6. Dilated RV on 2009 echo. However, echo (10/12) showed EF 55%, basal to mid inferior hypokinesis, grade II diastolic dysfunction, normal RV size and systolic function, PA systolic pressure;  Echocardiogram  (01/28/11):  EF 55%, inf mild HK, Gr 2 DD, mild LAE, mild RAE Past Medical History  Diagnosis Date  . Mild pulmonic regurgitation and RV dilation by prior echocardiogram     Dilated RV on 2009 echo. However, echo (10/12) showed EF 55%, basal to mid inferior hypokinesis, grade II diastolic dysfunction, normal RV size and systolic function, PA systolic pressure;  Echocardiogram (01/28/11):  EF 55%, inf mild HK, Gr 2 DD, mild LAE, mild RAE  . Hyperlipidemia   . Coronary artery disease     CAD: MI in 1/09 in [place]. Had BMS x 2 to the RCA at that time. 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 Nuclear Stress Test (01/2012):  diaph atten, no ischemia, EF 56%.    . Smoking   . OSA (obstructive sleep apnea)     on CPAP  . Obesity     Past Surgical History  Procedure Laterality Date  . Percutaneous coronary stent intervention (pci-s)  2009    BMS x 2 to RCA - hospital in [place]   . Appendectomy       Current Outpatient Prescriptions  Medication Sig Dispense Refill  . aspirin EC 81 MG tablet Take 1 tablet (81 mg  total) by mouth daily.    Marland Kitchen lisinopril (PRINIVIL,ZESTRIL) 10 MG tablet Take 10 mg by mouth daily.    . simvastatin (ZOCOR) 20 MG tablet Take 20 mg by mouth daily.     No current facility-administered medications for this visit.    Allergies:   Review of patient's allergies indicates no known allergies.    Social History:  The patient  reports that he has been smoking Cigarettes.  He has been smoking about 0.50 packs per day. He does not have any smokeless tobacco history on file. He reports that he does not use illicit drugs. Truck Geophysicist/field seismologist. Married.  Sister-in-law works in our office Loss adjuster, chartered).   Family History:  The patient's family history includes Heart attack in his cousin, father, and maternal grandfather.    ROS:  Please see the history of present illness.   Otherwise, review of systems are positive for  none.   All other systems are reviewed and negative.    PHYSICAL EXAM: VS:  BP 117/70 mmHg  Pulse 90  Ht 5\' 8"  (1.727 m)  Wt 302 lb (136.986 kg)  BMI 45.93 kg/m2  SpO2 97%    Wt Readings from Last 3 Encounters:  05/26/14 302 lb (136.986 kg)  03/22/13 280 lb (127.007 kg)  02/13/12 304 lb (137.893 kg)     GEN: Well nourished, well developed, in no acute distress HEENT: normal Neck: I cannot appreciate JVD, nocarotid bruits, no masses Cardiac:  Normal S1/S2, RRR; no murmur, no rubs or gallops, trace edema  Respiratory:  clear to auscultation bilaterally, no wheezing, rhonchi or rales. GI: soft, nontender, nondistended, + BS MS: no deformity or atrophy Skin: warm and dry  Neuro:  CNs II-XII intact, Strength and sensation are intact Psych: Normal affect   EKG:  EKG is ordered today.  It demonstrates:   NSR,HR 90, normal axis, NSSTTW changes.    Recent Labs: No results found for requested labs within last 365 days.    Lipid Panel No results found for: CHOL, TRIG, HDL, CHOLHDL, VLDL, LDLCALC, LDLDIRECT    ASSESSMENT AND PLAN:  1.  Coronary Artery Disease:  Overall doing well.  No anginal symptoms.  He is only taking Simvastatin, Lisinopril, ASA.  He is tolerating his current medications.  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    -  Letter written today.  ETT pending.    -  Schedule ETT.    -  Arrange BMET. 2.  Hyperlipidemia:  Continue Simvastatin.  He is tolerating this regimen.      -  Arrange FLP/LFTs. 3.  Tobacco Abuse:  We discussed the importance of tobacco cessation.  He is trying to quit.  4.  Obesity:  He wants to lose weight.  He is limited with his lifestyle/job.  He has very little options on the road besides fast food.  I have asked him to work on weight loss.  5.  Sleep Apnea:  He remains on CPAP.     Current medicines are reviewed at length with the patient today.  The patient does not have concerns regarding medicines.  The following changes  have been made:  no change  Labs/ tests ordered today include:  Orders Placed This Encounter  Procedures  . Lipid Profile  . Basic Metabolic Panel (BMET)  . Hepatic function panel  . EKG 12-Lead  . Exercise tolerance test     Disposition:   FU with Dr. Loralie Champagne  in 6 months.  Signed, Versie Starks, MHS 05/26/2014 2:22 PM    [facility] [address] [place], [place], [place]  [address] Phone: [phone]; Fax: [phone]

## 2014-06-09 ENCOUNTER — Other Ambulatory Visit: Payer: Self-pay

## 2014-06-12 ENCOUNTER — Telehealth (HOSPITAL_COMMUNITY): Payer: Self-pay

## 2014-06-12 NOTE — Telephone Encounter (Signed)
Encounter complete. 

## 2014-06-13 ENCOUNTER — Telehealth (HOSPITAL_COMMUNITY): Payer: Self-pay

## 2014-06-13 NOTE — Telephone Encounter (Signed)
Encounter complete. 

## 2014-06-17 ENCOUNTER — Ambulatory Visit (HOSPITAL_COMMUNITY)
Admission: RE | Admit: 2014-06-17 | Discharge: 2014-06-17 | Disposition: A | Discharge status: Home / Self Care | Payer: Self-pay | Source: Ambulatory Visit | Attending: Physician Assistant | Admitting: Physician Assistant

## 2014-06-17 DIAGNOSIS — I2510 Atherosclerotic heart disease of native coronary artery without angina pectoris: Secondary | ICD-10-CM | POA: Insufficient documentation

## 2014-06-17 NOTE — Procedures (Signed)
Exercise Treadmill Test   Test  Exercise Tolerance Test Ordering MD: Loralie Champagne, MD  Interpreting MD:   Unique Test No: 1 Treadmill:  1  Indication for ETT: Follow-UP CAD, DOT Physical  Contraindication to ETT: No   Stress Modality: exercise - treadmill  Cardiac Imaging Performed: non   Protocol: standard Bruce - maximal  Max BP:  212/85  Max MPHR (bpm): 168 85% MPR (bpm):  143  MPHR obtained (bpm):  157 % MPHR obtained:  93  Reached 85% MPHR (min:sec): 6:50 Total Exercise Time (min-sec):  8:15  Workload in METS: 10.10 Borg Scale: NA  Reason ETT Terminated:  fatigue, dyspnea    ST Segment Analysis At Rest: normal ST segments - no evidence of significant ST depression With Exercise: no evidence of significant ST depression  Other Information Arrhythmia:  No Angina during ETT:  absent (0) Quality of ETT:  diagnostic  ETT Interpretation:  normal - no evidence of ischemia by ST analysis  Comments: The patient had an moerate exercise tolerance.  There was no chest pain.  There was an appropriate level of dyspnea.  There were no arrhythmias, a normal heart rate response.   There were no ischemic ST T wave changes and a normal heart rate recovery.  Recommendations: No evidence of ischemia. There was an accelerated blood pressure response.

## 2014-06-18 ENCOUNTER — Encounter: Payer: Self-pay | Admitting: Physician Assistant

## 2014-06-19 ENCOUNTER — Other Ambulatory Visit: Payer: Self-pay

## 2014-06-19 MED ORDER — SIMVASTATIN 20 MG PO TABS: 20.0000 mg | ORAL_TABLET | Freq: Every day | ORAL | Status: DC

## 2014-06-19 MED ORDER — LISINOPRIL 10 MG PO TABS: 10.0000 mg | ORAL_TABLET | Freq: Every day | ORAL | Status: DC

## 2014-06-20 ENCOUNTER — Telehealth: Payer: Self-pay | Admitting: *Deleted

## 2014-06-20 NOTE — Telephone Encounter (Signed)
pt notified about ETT normal. Pt states he will need a copy of stress test and a new letter saying that stress test normal and he is cleared for driving. I stated I will let Brynda Rim. PA know this and we will get a new letter done for pick up, pt said ok.

## 2014-06-23 ENCOUNTER — Other Ambulatory Visit (INDEPENDENT_AMBULATORY_CARE_PROVIDER_SITE_OTHER): Payer: Self-pay | Admitting: *Deleted

## 2014-06-23 ENCOUNTER — Telehealth: Payer: Self-pay | Admitting: Physician Assistant

## 2014-06-23 DIAGNOSIS — E785 Hyperlipidemia, unspecified: Secondary | ICD-10-CM

## 2014-06-23 DIAGNOSIS — I2510 Atherosclerotic heart disease of native coronary artery without angina pectoris: Secondary | ICD-10-CM

## 2014-06-23 LAB — BASIC METABOLIC PANEL
BUN: 14 mg/dL (ref 6–23)
CHLORIDE: 102 meq/L (ref 96–112)
CO2: 29 mEq/L (ref 19–32)
CREATININE: 1.03 mg/dL (ref 0.40–1.50)
Calcium: 9.1 mg/dL (ref 8.4–10.5)
GFR: 80.3 mL/min (ref >60.00)
Glucose, Bld: 90 mg/dL (ref 70–99)
Potassium: 4.1 mEq/L (ref 3.5–5.1)
Sodium: 137 mEq/L (ref 135–145)

## 2014-06-23 LAB — LIPID PANEL
CHOLESTEROL: 183 mg/dL (ref 0–200)
HDL: 44.7 mg/dL (ref >39.00)
LDL Cholesterol: 113 mg/dL — ABNORMAL HIGH (ref 0–99)
NONHDL: 138.3
Total CHOL/HDL Ratio: 4
Triglycerides: 125 mg/dL (ref 0.0–149.0)
VLDL: 25 mg/dL (ref 0.0–40.0)

## 2014-06-23 LAB — HEPATIC FUNCTION PANEL
ALT: 27 U/L (ref 0–53)
AST: 19 U/L (ref 0–37)
Albumin: 4.1 g/dL (ref 3.5–5.2)
Alkaline Phosphatase: 99 U/L (ref 39–117)
BILIRUBIN TOTAL: 0.4 mg/dL (ref 0.2–1.2)
Bilirubin, Direct: 0.1 mg/dL (ref 0.0–0.3)
Total Protein: 7.2 g/dL (ref 6.0–8.3)

## 2014-06-23 NOTE — Telephone Encounter (Signed)
New message    Patient calling checking on the status of letter.

## 2014-06-23 NOTE — Telephone Encounter (Signed)
F/U        Pt returning call from earlier.   Please call back.

## 2014-06-27 MED ORDER — ATORVASTATIN CALCIUM 40 MG PO TABS
40.0000 mg | ORAL_TABLET | Freq: Every day | ORAL | Status: DC
Start: 2014-06-27 — End: 2022-08-23

## 2014-06-27 NOTE — Telephone Encounter (Signed)
pt notified of lab results and to d/c zocor and start lipitor 40 mg daily, Rx sent into [facility] in [place]. FLP/LFT 09/29/14. Pt aware new letter per Brynda Rim. PA at front desk. Advised pt to contact med rec for documents he needs, pt said ok and thank you.

## 2014-09-28 ENCOUNTER — Encounter: Payer: Self-pay | Admitting: Emergency Medicine

## 2014-09-28 ENCOUNTER — Emergency Department
Admission: EM | Admit: 2014-09-28 | Discharge: 2014-09-28 | Disposition: A | Discharge status: Home / Self Care | Payer: Self-pay | Attending: Emergency Medicine | Admitting: Emergency Medicine

## 2014-09-28 DIAGNOSIS — Y9389 Activity, other specified: Secondary | ICD-10-CM | POA: Insufficient documentation

## 2014-09-28 DIAGNOSIS — W57XXXA Bitten or stung by nonvenomous insect and other nonvenomous arthropods, initial encounter: Secondary | ICD-10-CM | POA: Insufficient documentation

## 2014-09-28 DIAGNOSIS — Y998 Other external cause status: Secondary | ICD-10-CM | POA: Insufficient documentation

## 2014-09-28 DIAGNOSIS — Z7982 Long term (current) use of aspirin: Secondary | ICD-10-CM | POA: Insufficient documentation

## 2014-09-28 DIAGNOSIS — Z79899 Other long term (current) drug therapy: Secondary | ICD-10-CM | POA: Insufficient documentation

## 2014-09-28 DIAGNOSIS — S80262A Insect bite (nonvenomous), left knee, initial encounter: Secondary | ICD-10-CM | POA: Insufficient documentation

## 2014-09-28 DIAGNOSIS — Z792 Long term (current) use of antibiotics: Secondary | ICD-10-CM | POA: Insufficient documentation

## 2014-09-28 DIAGNOSIS — Y9289 Other specified places as the place of occurrence of the external cause: Secondary | ICD-10-CM | POA: Insufficient documentation

## 2014-09-28 DIAGNOSIS — Z720 Tobacco use: Secondary | ICD-10-CM | POA: Insufficient documentation

## 2014-09-28 DIAGNOSIS — L02416 Cutaneous abscess of left lower limb: Secondary | ICD-10-CM | POA: Insufficient documentation

## 2014-09-28 DIAGNOSIS — L089 Local infection of the skin and subcutaneous tissue, unspecified: Secondary | ICD-10-CM

## 2014-09-28 MED ORDER — CLINDAMYCIN HCL 300 MG PO CAPS: 300.0000 mg | ORAL_CAPSULE | Freq: Three times a day (TID) | ORAL | Status: DC

## 2014-09-28 MED ORDER — SULFAMETHOXAZOLE-TRIMETHOPRIM 800-160 MG PO TABS: 1.0000 | ORAL_TABLET | Freq: Two times a day (BID) | ORAL | Status: DC

## 2014-09-28 MED ORDER — HYDROCODONE-ACETAMINOPHEN 5-325 MG PO TABS: 1.0000 | ORAL_TABLET | ORAL | Status: DC | PRN

## 2014-09-28 NOTE — ED Notes (Signed)
D/c instructions reviewed w/ pt - pt denies any further questions or concerns at present.  Pt instructed to not use alcohol, drive, or operate heavy machinery while take the prescription pain medications as they could make him drowsy - pt verbalized understanding.

## 2014-09-28 NOTE — Discharge Instructions (Signed)
Insect Bite Mosquitoes, flies, fleas, bedbugs, and many other insects can bite. Insect bites are different from insect stings. A sting is when venom is injected into the skin. Some insect bites can transmit infectious diseases. SYMPTOMS  Insect bites usually turn red, swell, and itch for 2 to 4 days. They often go away on their own. TREATMENT  Your caregiver may prescribe antibiotic medicines if a bacterial infection develops in the bite. HOME CARE INSTRUCTIONS  Do not scratch the bite area.  Keep the bite area clean and dry. Wash the bite area thoroughly with soap and water.  Put ice or cool compresses on the bite area.  Put ice in a plastic bag.  Place a towel between your skin and the bag.  Leave the ice on for 20 minutes, 4 times a day for the first 2 to 3 days, or as directed.  You may apply a baking soda paste, cortisone cream, or calamine lotion to the bite area as directed by your caregiver. This can help reduce itching and swelling.  Only take over-the-counter or prescription medicines as directed by your caregiver.  If you are given antibiotics, take them as directed. Finish them even if you start to feel better. You may need a tetanus shot if:  You cannot remember when you had your last tetanus shot.  You have never had a tetanus shot.  The injury broke your skin. If you get a tetanus shot, your arm may swell, get red, and feel warm to the touch. This is common and not a problem. If you need a tetanus shot and you choose not to have one, there is a rare chance of getting tetanus. Sickness from tetanus can be serious. SEEK IMMEDIATE MEDICAL CARE IF:   You have increased pain, redness, or swelling in the bite area.  You see a red line on the skin coming from the bite.  You have a fever.  You have joint pain.  You have a headache or neck pain.  You have unusual weakness.  You have a rash.  You have chest pain or shortness of breath.  You have abdominal pain,  nausea, or vomiting.  You feel unusually tired or sleepy. MAKE SURE YOU:   Understand these instructions.  Will watch your condition.  Will get help right away if you are not doing well or get worse. Document Released: 05/19/2004 Document Revised: 07/04/2011 Document Reviewed: 11/10/2010 [facility] Patient Information 2015 [place], [place]. This information is not intended to replace advice given to you by your health care provider. Make sure you discuss any questions you have with your health care provider.  Cellulitis Cellulitis is an infection of the skin and the tissue beneath it. The infected area is usually red and tender. Cellulitis occurs most often in the arms and lower legs.  CAUSES  Cellulitis is caused by bacteria that enter the skin through cracks or cuts in the skin. The most common types of bacteria that cause cellulitis are staphylococci and streptococci. SIGNS AND SYMPTOMS   Redness and warmth.  Swelling.  Tenderness or pain.  Fever. DIAGNOSIS  Your health care provider can usually determine what is wrong based on a physical exam. Blood tests may also be done. TREATMENT  Treatment usually involves taking an antibiotic medicine. HOME CARE INSTRUCTIONS   Take your antibiotic medicine as directed by your health care provider. Finish the antibiotic even if you start to feel better.  Keep the infected arm or leg elevated to reduce swelling.  Apply a warm cloth to the affected area up to 4 times per day to relieve pain.  Take medicines only as directed by your health care provider.  Keep all follow-up visits as directed by your health care provider. SEEK MEDICAL CARE IF:   You notice red streaks coming from the infected area.  Your red area gets larger or turns dark in color.  Your bone or joint underneath the infected area becomes painful after the skin has healed.  Your infection returns in the same area or another area.  You notice a swollen bump in the  infected area.  You develop new symptoms.  You have a fever. SEEK IMMEDIATE MEDICAL CARE IF:   You feel very sleepy.  You develop vomiting or diarrhea.  You have a general ill feeling (malaise) with muscle aches and pains. MAKE SURE YOU:   Understand these instructions.  Will watch your condition.  Will get help right away if you are not doing well or get worse. Document Released: 01/19/2005 Document Revised: 08/26/2013 Document Reviewed: 06/27/2011 [facility] Patient Information 2015 [place], [place]. This information is not intended to replace advice given to you by your health care provider. Make sure you discuss any questions you have with your health care provider.  Abscess An abscess is an infected area that contains a collection of pus and debris.It can occur in almost any part of the body. An abscess is also known as a furuncle or boil. CAUSES  An abscess occurs when tissue gets infected. This can occur from blockage of oil or sweat glands, infection of hair follicles, or a minor injury to the skin. As the body tries to fight the infection, pus collects in the area and creates pressure under the skin. This pressure causes pain. People with weakened immune systems have difficulty fighting infections and get certain abscesses more often.  SYMPTOMS Usually an abscess develops on the skin and becomes a painful mass that is red, warm, and tender. If the abscess forms under the skin, you may feel a moveable soft area under the skin. Some abscesses break open (rupture) on their own, but most will continue to get worse without care. The infection can spread deeper into the body and eventually into the bloodstream, causing you to feel ill.  DIAGNOSIS  Your caregiver will take your medical history and perform a physical exam. A sample of fluid may also be taken from the abscess to determine what is causing your infection. TREATMENT  Your caregiver may prescribe antibiotic medicines to  fight the infection. However, taking antibiotics alone usually does not cure an abscess. Your caregiver may need to make a small cut (incision) in the abscess to drain the pus. In some cases, gauze is packed into the abscess to reduce pain and to continue draining the area. HOME CARE INSTRUCTIONS   Only take over-the-counter or prescription medicines for pain, discomfort, or fever as directed by your caregiver.  If you were prescribed antibiotics, take them as directed. Finish them even if you start to feel better.  If gauze is used, follow your caregiver's directions for changing the gauze.  To avoid spreading the infection:  Keep your draining abscess covered with a bandage.  Wash your hands well.  Do not share personal care items, towels, or whirlpools with others.  Avoid skin contact with others.  Keep your skin and clothes clean around the abscess.  Keep all follow-up appointments as directed by your caregiver. SEEK MEDICAL CARE IF:   You  have increased pain, swelling, redness, fluid drainage, or bleeding.  You have muscle aches, chills, or a general ill feeling.  You have a fever. MAKE SURE YOU:   Understand these instructions.  Will watch your condition.  Will get help right away if you are not doing well or get worse. Document Released: 01/19/2005 Document Revised: 10/11/2011 Document Reviewed: 06/24/2011 [facility] Patient Information 2015 [place], [place]. This information is not intended to replace advice given to you by your health care provider. Make sure you discuss any questions you have with your health care provider.

## 2014-09-28 NOTE — ED Provider Notes (Signed)
[facility] Emergency Department Provider Note  ____________________________________________  Time seen: Approximately 11:38 AM  I have reviewed the triage vital signs and the nursing notes.   HISTORY  Chief Complaint Abscess    HPI Raymond Humphrey is a 54 y.o. male resents with a three-day history of abscess to his left knee. This started off as an insect bite and has progressively gotten bigger now includes warm tenderness and drainage. His pain as an 8 out of 10. 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   Past Medical History  Diagnosis Date  . Mild pulmonic regurgitation and RV dilation by prior echocardiogram     Dilated RV on 2009 echo. However, echo (10/12) showed EF 55%, basal to mid inferior hypokinesis, grade II diastolic dysfunction, normal RV size and systolic function, PA systolic pressure;  Echocardiogram (01/28/11):  EF 55%, inf mild HK, Gr 2 DD, mild LAE, mild RAE  . Hyperlipidemia   . Coronary artery disease     CAD: MI in 1/09 in [place]. Had BMS x 2 to the RCA at that time. 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 Nuclear Stress Test (01/2012):  diaph atten, no ischemia, EF 56%.    . Smoking   . OSA (obstructive sleep apnea)     on CPAP  . Obesity   . Hx of cardiovascular stress test     GXT (2/16): no ischemic EKG changes    Patient Active Problem List   Diagnosis Date Noted  . Coronary artery disease 01/04/2011  . RVF (right ventricular failure) 01/04/2011  . Hyperlipidemia 01/04/2011  . OSA (obstructive sleep apnea) 01/04/2011  . Smoking 01/04/2011    Past Surgical History  Procedure Laterality Date  . Percutaneous coronary stent intervention (pci-s)  2009    BMS x 2 to RCA - hospital in [place]   . Appendectomy      Current Outpatient Rx  Name  Route  Sig  Dispense  Refill  . aspirin EC 81  MG tablet   Oral   Take 1 tablet (81 mg total) by mouth daily.         Marland Kitchen atorvastatin (LIPITOR) 40 MG tablet   Oral   Take 1 tablet (40 mg total) by mouth daily.   30 tablet   11   . clindamycin (CLEOCIN) 300 MG capsule   Oral   Take 1 capsule (300 mg total) by mouth 3 (three) times daily.   30 capsule   0   . HYDROcodone-acetaminophen (NORCO) 5-325 MG per tablet   Oral   Take 1-2 tablets by mouth every 4 (four) hours as needed for moderate pain.   15 tablet   0   . lisinopril (PRINIVIL,ZESTRIL) 10 MG tablet   Oral   Take 1 tablet (10 mg total) by mouth daily.   30 tablet   6   . sulfamethoxazole-trimethoprim (BACTRIM DS,SEPTRA DS) 800-160 MG per tablet   Oral   Take 1 tablet by mouth 2 (two) times daily.   20 tablet   0     Allergies Review of patient's allergies indicates no known allergies.  Family History  Problem Relation Age of Onset  . Heart attack Father   . Heart attack Cousin   . Heart attack Maternal Grandfather     Social History History  Substance Use Topics  . Smoking status: Current  Every Day Smoker -- 0.50 packs/day    Types: Cigarettes  . Smokeless tobacco: Not on file  . Alcohol Use: Not on file     Comment: 6pack a week when home    Review of Systems Constitutional: No fever/chills Eyes: No visual changes. ENT: No sore throat. Cardiovascular: Denies chest pain. Respiratory: Denies shortness of breath. Gastrointestinal: No abdominal pain.  No nausea, no vomiting.  No diarrhea.  No constipation. Genitourinary: Negative for dysuria. Musculoskeletal: Negative for back pain. Skin: Negative for rash. As a for erythema and abscess to the left knee with warmth and tenderness. Neurological: Negative for headaches, focal weakness or numbness.  10-point ROS otherwise negative.  ____________________________________________   PHYSICAL EXAM:  VITAL SIGNS: ED Triage Vitals  Enc Vitals Group     BP 09/28/14 1124 130/81 mmHg     Pulse  Rate 09/28/14 1124 94     Resp 09/28/14 1124 20     Temp 09/28/14 1124 98 F (36.7 C)     Temp Source 09/28/14 1124 Oral     SpO2 09/28/14 1124 92 %     Weight 09/28/14 1124 280 lb (127.007 kg)     Height 09/28/14 1124 5\' 8"  (1.727 m)     Head Cir --      Peak Flow --      Pain Score 09/28/14 1121 8     Pain Loc --      Pain Edu? --      Excl. in [place]? --     Constitutional: Alert and oriented. Well appearing and in no acute distress. Musculoskeletal: No lower extremity tenderness nor edema.  No joint effusions. Neurologic:  Normal speech and language. No gross focal neurologic deficits are appreciated. Speech is normal. No gait instability. Skin:  Skin is warm, dry and intact. No rash noted. 4 cm area of erythema and circumventing a 1 cm pustular draining lesion left knee. Minimal fluctuance Psychiatric: Mood and affect are normal. Speech and behavior are normal.  ____________________________________________   LABS (all labs ordered are listed, but only abnormal results are displayed)  Labs Reviewed  WOUND CULTURE   ____________________________________________  EKG  Deferred ____________________________________________  RADIOLOGY  Deferred ____________________________________________   PROCEDURES  Procedure(s) performed: None  Wound prepped and draped in sterile fashion. 25-gauge needle inserted and aerobic and anaerobic cultures obtained. Covered with sterile gauze.  Critical Care performed: No  ____________________________________________   INITIAL IMPRESSION / ASSESSMENT AND PLAN / ED COURSE  Pertinent labs & imaging results that were available during my care of the patient were reviewed by me and considered in my medical decision making (see chart for details).   abscess/insect bite to left knee. Cultures obtained will start prophylactically on TMP/SMX and clindamycin. Hydrocodone given for pain. Patient follow-up if symptoms worsen. Cultures pending.  Patient voices no other emergency medical complaints at this time. ___________________________________________   FINAL CLINICAL IMPRESSION(S) / ED DIAGNOSES  Final diagnoses:  Abscess of knee, left  Insect bite of knee, infected, left, initial encounter      Arlyss Repress, PA-C 09/28/14 1200  Harvest Dark, MD 09/28/14 [id number]

## 2014-09-28 NOTE — ED Notes (Signed)
Pt with abscess to left knee for three days. Redness and swelling noted to left knee.

## 2014-09-29 ENCOUNTER — Other Ambulatory Visit (INDEPENDENT_AMBULATORY_CARE_PROVIDER_SITE_OTHER): Payer: Self-pay

## 2014-09-29 DIAGNOSIS — E785 Hyperlipidemia, unspecified: Secondary | ICD-10-CM

## 2014-09-29 DIAGNOSIS — I2510 Atherosclerotic heart disease of native coronary artery without angina pectoris: Secondary | ICD-10-CM

## 2014-10-01 ENCOUNTER — Encounter: Payer: Self-pay | Admitting: Physician Assistant

## 2014-10-01 ENCOUNTER — Encounter: Payer: Self-pay | Admitting: Emergency Medicine

## 2014-10-01 ENCOUNTER — Emergency Department
Admission: EM | Admit: 2014-10-01 | Discharge: 2014-10-01 | Disposition: A | Discharge status: Home / Self Care | Payer: Self-pay | Attending: Emergency Medicine | Admitting: Emergency Medicine

## 2014-10-01 DIAGNOSIS — Z22322 Carrier or suspected carrier of Methicillin resistant Staphylococcus aureus: Secondary | ICD-10-CM

## 2014-10-01 DIAGNOSIS — A4902 Methicillin resistant Staphylococcus aureus infection, unspecified site: Secondary | ICD-10-CM | POA: Insufficient documentation

## 2014-10-01 DIAGNOSIS — Z720 Tobacco use: Secondary | ICD-10-CM | POA: Insufficient documentation

## 2014-10-01 DIAGNOSIS — Z79899 Other long term (current) drug therapy: Secondary | ICD-10-CM | POA: Insufficient documentation

## 2014-10-01 DIAGNOSIS — Z7982 Long term (current) use of aspirin: Secondary | ICD-10-CM | POA: Insufficient documentation

## 2014-10-01 DIAGNOSIS — Z792 Long term (current) use of antibiotics: Secondary | ICD-10-CM | POA: Insufficient documentation

## 2014-10-01 LAB — WOUND CULTURE: Special Requests: NORMAL

## 2014-10-01 MED ORDER — BACITRACIN ZINC 500 UNIT/GM EX OINT
TOPICAL_OINTMENT | CUTANEOUS | Status: AC
  Filled 2014-10-01: qty 0.9

## 2014-10-01 NOTE — ED Provider Notes (Signed)
[facility] Emergency Department Provider Note  ____________________________________________  Time seen: Approximately 7:04 PM  I have reviewed the triage vital signs and the nursing notes.   HISTORY  Chief Complaint Abscess    HPI Raymond Humphrey is a 54 y.o. male who presents for follow-up of a wound abscess to his left knee 3 days ago. Patient states some improvement but increased in maceration of the skin. Denies any more pain.Culture tested positive for MRSA. Patient currently taking both Bactrim DS and clindamycin. Seen by this provider.   Past Medical History  Diagnosis Date  . Mild pulmonic regurgitation and RV dilation by prior echocardiogram     Dilated RV on 2009 echo. However, echo (10/12) showed EF 55%, basal to mid inferior hypokinesis, grade II diastolic dysfunction, normal RV size and systolic function, PA systolic pressure;  Echocardiogram (01/28/11):  EF 55%, inf mild HK, Gr 2 DD, mild LAE, mild RAE  . Hyperlipidemia   . Coronary artery disease     CAD: MI in 1/09 in [place]. Had BMS x 2 to the RCA at that time. 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 Nuclear Stress Test (01/2012):  diaph atten, no ischemia, EF 56%.    . Smoking   . OSA (obstructive sleep apnea)     on CPAP  . Obesity   . Hx of cardiovascular stress test     GXT (2/16): no ischemic EKG changes    Patient Active Problem List   Diagnosis Date Noted  . Coronary artery disease 01/04/2011  . RVF (right ventricular failure) 01/04/2011  . Hyperlipidemia 01/04/2011  . OSA (obstructive sleep apnea) 01/04/2011  . Smoking 01/04/2011    Past Surgical History  Procedure Laterality Date  . Percutaneous coronary stent intervention (pci-s)  2009    BMS x 2 to RCA - hospital in [place]   . Appendectomy      Current Outpatient Rx  Name  Route  Sig  Dispense  Refill  . aspirin EC 81 MG tablet    Oral   Take 1 tablet (81 mg total) by mouth daily.         Marland Kitchen atorvastatin (LIPITOR) 40 MG tablet   Oral   Take 1 tablet (40 mg total) by mouth daily.   30 tablet   11   . clindamycin (CLEOCIN) 300 MG capsule   Oral   Take 1 capsule (300 mg total) by mouth 3 (three) times daily.   30 capsule   0   . HYDROcodone-acetaminophen (NORCO) 5-325 MG per tablet   Oral   Take 1-2 tablets by mouth every 4 (four) hours as needed for moderate pain.   15 tablet   0   . lisinopril (PRINIVIL,ZESTRIL) 10 MG tablet   Oral   Take 1 tablet (10 mg total) by mouth daily.   30 tablet   6   . sulfamethoxazole-trimethoprim (BACTRIM DS,SEPTRA DS) 800-160 MG per tablet   Oral   Take 1 tablet by mouth 2 (two) times daily.   20 tablet   0     Allergies Review of patient's allergies indicates no known allergies.  Family History  Problem Relation Age of Onset  . Heart attack Father   . Heart attack Cousin   . Heart attack Maternal Grandfather     Social History History  Substance Use Topics  . Smoking status: Current Every Day Smoker -- 0.50 packs/day  Types: Cigarettes  . Smokeless tobacco: Not on file  . Alcohol Use: Not on file     Comment: 6pack a week when home    Review of Systems Constitutional: No fever/chills Eyes: No visual changes. ENT: No sore throat. Cardiovascular: Denies chest pain. Respiratory: Denies shortness of breath. Gastrointestinal: No abdominal pain.  No nausea, no vomiting.  No diarrhea.  No constipation. Genitourinary: Negative for dysuria. Musculoskeletal: Negative for back pain. Skin: Positive for wound. Neurological: Negative for headaches, focal weakness or numbness.  10-point ROS otherwise negative.  ____________________________________________   PHYSICAL EXAM:  VITAL SIGNS: ED Triage Vitals  Enc Vitals Group     BP 10/01/14 1824 132/77 mmHg     Pulse Rate 10/01/14 1824 84     Resp 10/01/14 1824 20     Temp 10/01/14 1824 98.4 F (36.9  C)     Temp Source 10/01/14 1824 Oral     SpO2 10/01/14 1824 95 %     Weight 10/01/14 1824 284 lb (128.822 kg)     Height 10/01/14 1824 5\' 7"  (1.702 m)     Head Cir --      Peak Flow --      Pain Score 10/01/14 1829 4     Pain Loc --      Pain Edu? --      Excl. in [place]? --     Constitutional: Alert and oriented. Well appearing and in no acute distress. Eyes: Conjunctivae are normal. PERRL. EOMI. Head: Atraumatic. Nose: No congestion/rhinnorhea. Mouth/Throat: Mucous membranes are moist.  Oropharynx non-erythematous. Neck: No stridor.   Cardiovascular: Normal rate, regular rhythm. Grossly normal heart sounds.  Good peripheral circulation. Respiratory: Normal respiratory effort.  No retractions. Lungs CTAB. Gastrointestinal: Soft and nontender. No distention. No abdominal bruits. No CVA tenderness. Musculoskeletal: No lower extremity tenderness nor edema.  No joint effusions. Neurologic:  Normal speech and language. No gross focal neurologic deficits are appreciated. Speech is normal. No gait instability. Skin:  2 cm nummular area of maceration noted with minimal pustular drainage. Decreased erythema from 3 days ago. Psychiatric: Mood and affect are normal. Speech and behavior are normal.  ____________________________________________   LABS (all labs ordered are listed, but only abnormal results are displayed)  Labs Reviewed - No data to display ____________________________________________  EKG  Deferred ____________________________________________  RADIOLOGY  Deferred ____________________________________________   PROCEDURES  Procedure(s) performed: None  Critical Care performed: No  ____________________________________________   INITIAL IMPRESSION / ASSESSMENT AND PLAN / ED COURSE  Pertinent labs & imaging results that were available during my care of the patient were reviewed by me and considered in my medical decision making (see chart for  details).  Diagnosis: MRSA.  Wound cleaned and covered with bacitracin and gauze. Patient to continue current antibiotics. Return to work without restriction. He is to return for further evaluation in 2-3 days or if symptoms worsen. She voices no other emergency medical conditions at this time. ____________________________________________   FINAL CLINICAL IMPRESSION(S) / ED DIAGNOSES  Final diagnoses:  MRSA (methicillin resistant staph aureus) culture positive       Arlyss Repress, PA-C 10/01/14 1909  Delman Kitten, MD 10/01/14 2036

## 2014-10-01 NOTE — Discharge Instructions (Signed)
MRSA Infection MRSA stands for methicillin-resistant Staphylococcus aureus. This type of infection is caused by Staphylococcus aureus bacteria that are no longer affected by the medicines used to kill them (drug resistant). Staphylococcus (staph) bacteria are normally found on the skin or in the nose of healthy people. In most cases, these bacteria do not cause infection. But if these resistant bacteria enter your body through a cut or sore, they can cause a serious infection on your skin or in other parts of your body. There is a slight chance that the staph on your skin or in your nose is MRSA. There are two types of MRSA infections:  Hospital-acquired MRSA is bacteria that you get in the hospital.  Community-acquired MRSA is bacteria that you get somewhere other than in a hospital. RISK FACTORS Hospital-acquired MRSA is more common. You could be at risk for this infection if you are in the hospital and you:  Have surgery or a procedure.  Have an IV access or a catheter tube placed in your body.  Have weak resistance to germs (weakened immune system).  Are elderly.  Are on kidney dialysis. You could be at risk for community-acquired MRSA if you have a break in your skin and come into contact with MRSA. This may happen if you:  Play sports where there is skin-to-skin contact.  Live in a crowded setting, like a dormitory or a [employer]  Share towels, razors, or sports equipment with other people. SYMPTOMS  Symptoms of hospital-acquired MRSA depend on where MRSA has spread. Symptoms may include:  Wound infection.  Skin infection.  Rash.  Pneumonia.  Fever and chills.  Difficulty breathing.  Chest pain. Community-acquired MRSA is most likely to start as a scratch or cut that becomes infected. Symptoms may include:  A pus-filled pimple.  A boil on your skin.  Pus draining from your skin.  A sore (abscess) under your skin or somewhere in your body.  Fever  with or without chills. DIAGNOSIS  The diagnosis of MRSA is made by taking a sample from an infected area and sending it to a lab for testing. A lab technician can grow (culture) MRSA and check it under a microscope. The cultured MRSA can be tested to see which type of antibiotic medicine will work to treat it. Newer tests can identify MRSA more quickly by testing bacteria samples for MRSA genes. Your health care provider can diagnose MRSA using samples from:   Cuts or wounds in infected areas.  Nasal swabs.  Saliva or cough specimens from deep in the lungs (sputum).  Urine.  Blood. You may also have:  Imaging studies (such as X-Krull or MRI) to check if the infection has spread to the lungs, bones, or joints.  A culture and sensitivity test of blood or fluids from inside the joints. TREATMENT  Treatment depends on how severe, deep, or extensive the infection is. Very bad infections may require a hospital stay.  Some skin infections, such as a small boil or sore (abscess), may be treated by draining pus from the site of the infection.  More extensive surgery to drain pus may be necessary for deeper or more widespread soft tissue infections.  You may then have to take antibiotic medicine given by mouth or through a vein. You may start antibiotic treatment right away or after testing can be done to see what antibiotic medicine should be used. HOME CARE INSTRUCTIONS   Take your antibiotics as directed by your health care provider. Take  the medicine as prescribed until it is finished.  Avoid close contact with those around you as much as possible. Do not use towels, razors, toothbrushes, bedding, or other items that will be used by others.  Wash your hands frequently for 15 seconds with soap and water. Dry your hands with a clean or disposable towel.  When you are not able to wash your hands, use hand sanitizer that is more than 60 percent alcohol.  Wash towels, sheets, or clothes in  the washing machine with detergent and hot water. Dry them in a hot dryer.  Follow your health care provider's instructions for wound care. Wash your hands before and after changing your bandages.  Always shower after exercising.  Keep all cuts and scrapes clean and covered with a bandage.  Be sure to tell all your health care providers that you have MRSA so they are aware of your infection. SEEK MEDICAL CARE IF:  You have a cut, scrape, pimple, or boil that becomes red, swollen, or painful or has pus in it.  You have pus draining from your skin.  You have an abscess under your skin or somewhere in your body. SEEK IMMEDIATE MEDICAL CARE IF:   You have symptoms of a skin infection with a fever or chills.  You have trouble breathing.  You have chest pain.  You have a skin wound and you become nauseous or start vomiting. MAKE SURE YOU:  Understand these instructions.  Will watch your condition.  Will get help right away if you are not doing well or get worse. Document Released: 04/11/2005 Document Revised: 04/16/2013 Document Reviewed: 02/01/2013 [facility] Patient Information 2015 [place], [place]. This information is not intended to replace advice given to you by your health care provider. Make sure you discuss any questions you have with your health care provider.  MRSA FAQs What is MRSA? Staphylococcus aureus (pronounced staff-ill-oh-KOK-us AW-ree-us), or "Staph" is a very common germ that about 1 out of every 3 people have on their skin or in their nose. This germ does not cause any problems for most people who have it on their skin. But sometimes it can cause serious infections such as skin or wound infections, pneumonia, or infections of the blood.  Antibiotics are given to kill Staph germs when they cause infections. Some Staph are resistant, meaning they cannot be killed by some antibiotics. "Methicillin-resistant Staphylococcus aureus" or "MRSA" is a type of Staph that is  resistant to some of the antibiotics that are often used to treat Staph infections. Who is most likely to get an MRSA infection? In the hospital, people who are more likely to get an MRSA infection are people who:  have other health conditions making them sick  have been in the hospital or a nursing home  have been treated with antibiotics. People who are healthy and who have not been in the hospital or a nursing home can also get MRSA infections. These infections usually involve the skin. More information about this type of MRSA infection, known as "community-associated MRSA" infection, is available from the Centers for Disease Control and Prevention (CDC). http://rios.biz/ How do I get an MRSA infection? People who have MRSA germs on their skin or who are infected with MRSA may be able to spread the germ to other people. MRSA can be passed on to bed linens, bed rails, bathroom fixtures, and medical equipment. It can spread to other people on contaminated equipment and on the hands of doctors, nurses, other healthcare providers  and visitors. Can MRSA infections be treated? Yes, there are antibiotics that can kill MRSA germs. Some patients with MRSA abscesses may need surgery to drain the infection. Your healthcare provider will determine which treatments are best for you. What are some of the things that hospitals are doing to prevent MRSA infections? To prevent MRSA infections, doctors, nurses and other healthcare providers:  Clean their hands with soap and water or an alcohol-based hand rub before and after caring for every patient.  Carefully clean hospital rooms and medical equipment.  Use Contact Precautions when caring for patients with MRSA. Contact Precautions mean:  Whenever possible, patients with MRSA will have a single room or will share a room only with someone else who also has MRSA.  Healthcare providers will put on gloves and wear a gown over their clothing while  taking care of patients with MRSA.  Visitors may also be asked to wear a gown and gloves.  When leaving the room, hospital providers and visitors remove their gown and gloves and clean their hands.  Patients on Contact Precautions are asked to stay in their hospital rooms as much as possible. They should not go to common areas, such as the gift shop or cafeteria. They may go to other areas of the hospital for treatments and tests.  May test some patients to see if they have MRSA on their skin. This test involves rubbing a cotton-tipped swab in the patient's nostrils or on the skin. What can I do to help prevent MRSA infections? In the hospital  Make sure that all doctors, nurses, and other healthcare providers clean their hands with soap and water or an alcohol-based hand rub before and after caring for you. If you do not see your providers clean their hands, please ask them to do so. When you go home  If you have wounds or an intravascular device (such as a catheter or dialysis port) make sure that you know how to take care of them. Can my friends and family get MRSA when they visit me? The chance of getting MRSA while visiting a person who has MRSA is very low. To decrease the chance of getting MRSA your family and friends should:  Clean their hands before they enter your room and when they leave.  Ask a healthcare provider if they need to wear protective gowns and gloves when they visit you. What do I need to do when I go home from the hospital? To prevent another MRSA infection and to prevent spreading MRSA to others:  Keep taking any antibiotics prescribed by your doctor. Don't take half-doses or stop before you complete your prescribed course.  Clean your hands often, especially before and after changing your wound dressing or bandage.  People who live with you should clean their hands often as well.  Keep any wounds clean and change bandages as instructed until healed.  Avoid  sharing personal items such as towels or razors.  Wash and dry your clothes and bed linens in the warmest temperatures recommended on the labels.  Tell your healthcare providers that you have MRSA. This includes home health nurses and aides, therapists, and personnel in doctors' offices.  Your doctor may have more instructions for you. If you have questions, please ask your doctor or nurse. Developed and co-sponsored by [employer] for [place] [phone]); Infectious Diseases Society of [place] (IDSA); [place]-Perez; Association for Professionals in Infection Control and Epidemiology (APIC); Centers for Disease Control and Prevention (  CDC); and [employer]. Document Released: 04/16/2013 Document Reviewed: 04/16/2013 [facility] Patient Information 2015 [place], [place]. This information is not intended to replace advice given to you by your health care provider. Make sure you discuss any questions you have with your health care provider.

## 2014-10-01 NOTE — ED Notes (Addendum)
Pt states he was seen Sunday for an abscess to left knee. Currently on Antibiotic therapy. Pt states that the site is draining more and getting larger. Pt states he had it cultured but has not heard back on any results. Pt reports fever at home of 100.1 orally. Then states no fever for the past 2 days.

## 2016-02-23 ENCOUNTER — Encounter: Payer: Self-pay | Admitting: Radiology

## 2016-02-23 ENCOUNTER — Emergency Department: Payer: Self-pay

## 2016-02-23 ENCOUNTER — Emergency Department
Admission: EM | Admit: 2016-02-23 | Discharge: 2016-02-23 | Disposition: A | Discharge status: Home / Self Care | Payer: Self-pay | Attending: Emergency Medicine | Admitting: Emergency Medicine

## 2016-02-23 DIAGNOSIS — Z792 Long term (current) use of antibiotics: Secondary | ICD-10-CM | POA: Insufficient documentation

## 2016-02-23 DIAGNOSIS — I10 Essential (primary) hypertension: Secondary | ICD-10-CM | POA: Insufficient documentation

## 2016-02-23 DIAGNOSIS — F17210 Nicotine dependence, cigarettes, uncomplicated: Secondary | ICD-10-CM | POA: Insufficient documentation

## 2016-02-23 DIAGNOSIS — N200 Calculus of kidney: Secondary | ICD-10-CM

## 2016-02-23 DIAGNOSIS — Z79899 Other long term (current) drug therapy: Secondary | ICD-10-CM | POA: Insufficient documentation

## 2016-02-23 DIAGNOSIS — N201 Calculus of ureter: Secondary | ICD-10-CM | POA: Insufficient documentation

## 2016-02-23 DIAGNOSIS — I2510 Atherosclerotic heart disease of native coronary artery without angina pectoris: Secondary | ICD-10-CM | POA: Insufficient documentation

## 2016-02-23 DIAGNOSIS — Z7982 Long term (current) use of aspirin: Secondary | ICD-10-CM | POA: Insufficient documentation

## 2016-02-23 HISTORY — DX: Essential (primary) hypertension: I10

## 2016-02-23 LAB — COMPREHENSIVE METABOLIC PANEL WITH GFR
ALT: 41 U/L (ref 17–63)
AST: 31 U/L (ref 15–41)
Albumin: 4.3 g/dL (ref 3.5–5.0)
Alkaline Phosphatase: 81 U/L (ref 38–126)
Anion gap: 5 (ref 5–15)
BUN: 20 mg/dL (ref 6–20)
CO2: 28 mmol/L (ref 22–32)
Calcium: 8.6 mg/dL — ABNORMAL LOW (ref 8.9–10.3)
Chloride: 103 mmol/L (ref 101–111)
Creatinine, Ser: 1.36 mg/dL — ABNORMAL HIGH (ref 0.61–1.24)
GFR calc Af Amer: >60 mL/min
GFR calc non Af Amer: 58 mL/min — ABNORMAL LOW
Glucose, Bld: 107 mg/dL — ABNORMAL HIGH (ref 65–99)
Potassium: 4.4 mmol/L (ref 3.5–5.1)
Sodium: 136 mmol/L (ref 135–145)
Total Bilirubin: 0.3 mg/dL (ref 0.3–1.2)
Total Protein: 7.6 g/dL (ref 6.5–8.1)

## 2016-02-23 LAB — URINALYSIS COMPLETE WITH MICROSCOPIC (ARMC ONLY)
Bacteria, UA: NONE SEEN
Bilirubin Urine: NEGATIVE
Glucose, UA: NEGATIVE mg/dL
Ketones, ur: NEGATIVE mg/dL
Leukocytes, UA: NEGATIVE
Nitrite: NEGATIVE
Protein, ur: NEGATIVE mg/dL
Specific Gravity, Urine: 1.023 (ref 1.005–1.030)
pH: 5 (ref 5.0–8.0)

## 2016-02-23 LAB — CBC
HCT: 49.6 % (ref 40.0–52.0)
Hemoglobin: 16.7 g/dL (ref 13.0–18.0)
MCH: 30.2 pg (ref 26.0–34.0)
MCHC: 33.6 g/dL (ref 32.0–36.0)
MCV: 90 fL (ref 80.0–100.0)
Platelets: 226 K/uL (ref 150–440)
RBC: 5.51 MIL/uL (ref 4.40–5.90)
RDW: 13.4 % (ref 11.5–14.5)
WBC: 10.9 K/uL — ABNORMAL HIGH (ref 3.8–10.6)

## 2016-02-23 LAB — LIPASE, BLOOD: Lipase: 46 U/L (ref 11–51)

## 2016-02-23 MED ORDER — TAMSULOSIN HCL 0.4 MG PO CAPS: 0.4000 mg | ORAL_CAPSULE | Freq: Every day | ORAL | 0 refills | Status: DC

## 2016-02-23 MED ORDER — IOPAMIDOL (ISOVUE-370) INJECTION 76%
100.0000 mL | Freq: Once | INTRAVENOUS | Status: AC | PRN
  Administered 2016-02-23: 100 mL via INTRAVENOUS

## 2016-02-23 MED ORDER — ONDANSETRON 4 MG PO TBDP: 4.0000 mg | ORAL_TABLET | Freq: Four times a day (QID) | ORAL | 0 refills | Status: DC | PRN

## 2016-02-23 MED ORDER — HYDROCODONE-ACETAMINOPHEN 5-325 MG PO TABS: 1.0000 | ORAL_TABLET | Freq: Four times a day (QID) | ORAL | 0 refills | Status: DC | PRN

## 2016-02-23 NOTE — ED Provider Notes (Signed)
[facility] Emergency Department Provider Note   ____________________________________________   First MD Initiated Contact with Patient 02/23/16 [phone]     (approximate)  I have reviewed the triage vital signs and the nursing notes.   HISTORY  Chief Complaint Back Pain    HPI Kassen Fine is a 55 y.o. male parts of previous history of a heart attack about 6 years ago and high blood pressure and hypertension.  He also reports that he is not taking any medications currently except occasionally takes his simvastatin  Last night he woke up at about 2:58 in the morning to use the bathroom, shortly after urinating again experiencing pain in his mid to lower back. Reports he had a pain over the left mid flank that was moderate then became severe in intensity, the pain seemed to move from his back down to his left lower abdomen. He took 2 Aleve and the pain improved, though he has felt slight twinges of it while in the waiting room. He presently reports no pain and feeling comfortable.  There was no chest pain or shortness of breath. His only previous surgery is abdominal appendix in the past. Denies blood in the urine trouble urinating or history of kidney stones. No fevers or chills.  He has chronic feeling of tingling in both feet. No new changes   Past Medical History:  Diagnosis Date  . Coronary artery disease    CAD: MI in 1/09 in [place]. Had BMS x 2 to the RCA at that time. 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 Nuclear Stress Test (01/2012):  diaph atten, no ischemia, EF 56%.    Marland Kitchen Hx of cardiovascular stress test    GXT (2/16): no ischemic EKG changes  . Hyperlipidemia   . Hypertension   . Mild pulmonic regurgitation and RV dilation by prior echocardiogram    Dilated RV on 2009 echo. However, echo (10/12) showed EF 55%, basal to mid inferior hypokinesis, grade II diastolic  dysfunction, normal RV size and systolic function, PA systolic pressure;  Echocardiogram (01/28/11):  EF 55%, inf mild HK, Gr 2 DD, mild LAE, mild RAE  . Obesity   . OSA (obstructive sleep apnea)    on CPAP  . Smoking     Patient Active Problem List   Diagnosis Date Noted  . Coronary artery disease 01/04/2011  . RVF (right ventricular failure) ([place]) 01/04/2011  . Hyperlipidemia 01/04/2011  . OSA (obstructive sleep apnea) 01/04/2011  . Smoking 01/04/2011    Past Surgical History:  Procedure Laterality Date  . APPENDECTOMY    . PERCUTANEOUS CORONARY STENT INTERVENTION (PCI-S)  2009   BMS x 2 to RCA - hospital in [place]     Prior to Admission medications   Medication Sig Start Date End Date Taking? Authorizing Provider  aspirin EC 81 MG tablet Take 1 tablet (81 mg total) by mouth daily. 02/07/12   Larey Dresser, MD  atorvastatin (LIPITOR) 40 MG tablet Take 1 tablet (40 mg total) by mouth daily. 06/27/14   Liliane Shi, PA-C  clindamycin (CLEOCIN) 300 MG capsule Take 1 capsule (300 mg total) by mouth 3 (three) times daily. 09/28/14   Arlyss Repress, PA-C  HYDROcodone-acetaminophen (NORCO/VICODIN) 5-325 MG tablet Take 1-2 tablets by mouth every 6 (six) hours as needed for moderate pain. 02/23/16   Delman Kitten, MD  lisinopril (PRINIVIL,ZESTRIL) 10 MG tablet Take 1 tablet (10 mg total)  by mouth daily. 06/19/14   Larey Dresser, MD  ondansetron (ZOFRAN ODT) 4 MG disintegrating tablet Take 1 tablet (4 mg total) by mouth every 6 (six) hours as needed for nausea or vomiting. 02/23/16   Delman Kitten, MD  sulfamethoxazole-trimethoprim (BACTRIM DS,SEPTRA DS) 800-160 MG per tablet Take 1 tablet by mouth 2 (two) times daily. 09/28/14   Arlyss Repress, PA-C  tamsulosin (FLOMAX) 0.4 MG CAPS capsule Take 1 capsule (0.4 mg total) by mouth daily. 02/23/16   Delman Kitten, MD  Patient reports currently not taking any of these except for his statin  Allergies Review of patient's allergies indicates no known  allergies.  Family History  Problem Relation Age of Onset  . Heart attack Father   . Heart attack Cousin   . Heart attack Maternal Grandfather     Social History Social History  Substance Use Topics  . Smoking status: Current Every Day Smoker    Packs/day: 0.50    Types: Cigarettes  . Smokeless tobacco: Not on file  . Alcohol use Not on file     Comment: 6pack a week when home    Review of Systems Constitutional: No fever/chills Eyes: No visual changes. ENT: No sore throat. Cardiovascular: Denies chest pain. Respiratory: Denies shortness of breath. Gastrointestinal:  No nausea, no vomiting.  No diarrhea.  No constipation. Genitourinary: Negative for dysuria. Musculoskeletal: See history of present illness. No recent injuries or trauma Skin: Negative for rash. Neurological: Negative for headaches, focal weakness or numbness.  10-point ROS otherwise negative.  ____________________________________________   PHYSICAL EXAM:  VITAL SIGNS: ED Triage Vitals  Enc Vitals Group     BP 02/23/16 0602 (!) 132/95     Pulse Rate 02/23/16 0602 88     Resp 02/23/16 0602 18     Temp 02/23/16 0602 97.4 F (36.3 C)     Temp Source 02/23/16 0602 Oral     SpO2 02/23/16 0602 95 %     Weight 02/23/16 0602 (!) 310 lb (140.6 kg)     Height 02/23/16 0602 5\' 6"  (1.676 m)     Head Circumference --      Peak Flow --      Pain Score 02/23/16 0603 3     Pain Loc --      Pain Edu? --      Excl. in [place]? --     Constitutional: Alert and oriented. Well appearing and in no acute distress.Accompanied by his wife. Both are very pleasant. Eyes: Conjunctivae are normal. PERRL. EOMI. Head: Atraumatic. Nose: No congestion/rhinnorhea. Mouth/Throat: Mucous membranes are moist.  Oropharynx non-erythematous. Neck: No stridor.   Cardiovascular: Normal rate, regular rhythm. Grossly normal heart sounds.  Good peripheral circulation. Respiratory: Normal respiratory effort.  No retractions. Lungs  CTAB. Gastrointestinal: Soft and nontender. No distention. Rather obese. No abdominal bruits. No CVA tenderness. No midline lumbar or thoracic tenderness. Musculoskeletal: No lower extremity tenderness nor edema.  No joint effusions. Neurologic:  Normal speech and language. No gross focal neurologic deficits are appreciated. Normal dorsalis pedis pulse bilateral. Skin:  Skin is warm, dry and intact. No rash noted. Psychiatric: Mood and affect are normal. Speech and behavior are normal.  ____________________________________________   LABS (all labs ordered are listed, but only abnormal results are displayed)  Labs Reviewed  CBC - Abnormal; Notable for the following:       Result Value   WBC 10.9 (*)    All other components within normal limits  COMPREHENSIVE METABOLIC PANEL -  Abnormal; Notable for the following:    Glucose, Bld 107 (*)    Creatinine, Ser 1.36 (*)    Calcium 8.6 (*)    GFR calc non Af Amer 58 (*)    All other components within normal limits  URINALYSIS COMPLETEWITH MICROSCOPIC ([facility] ONLY) - Abnormal; Notable for the following:    Color, Urine YELLOW (*)    APPearance CLEAR (*)    Hgb urine dipstick 2+ (*)    Squamous Epithelial / LPF 0-5 (*)    All other components within normal limits  LIPASE, BLOOD   ____________________________________________  EKG   ____________________________________________  RADIOLOGY  Ct Angio Abd/pel W And/or Wo Contrast  Result Date: 02/23/2016 CLINICAL DATA:  55 year old with left flank pain. Evaluate for aortic dissection. EXAM: CTA ABDOMEN AND PELVIS WITH CONTRAST TECHNIQUE: Multidetector CT imaging of the abdomen and pelvis was performed using the standard protocol during bolus administration of intravenous contrast. Multiplanar reconstructed images and MIPs were obtained and reviewed to evaluate the vascular anatomy. CONTRAST:  100 mL Isovue 370 COMPARISON:  None. FINDINGS: VASCULAR Aorta: Normal caliber of the abdominal  aorta without dissection. Atherosclerotic calcifications in the abdominal aorta but no significant stenosis. Celiac: Patent without evidence of aneurysm, dissection, vasculitis or significant stenosis. SMA: Patent without evidence of aneurysm, dissection, vasculitis or significant stenosis. Renals: Both renal arteries are patent without evidence of aneurysm, dissection, vasculitis, fibromuscular dysplasia or significant stenosis. IMA: Patent without evidence of aneurysm, dissection, vasculitis or significant stenosis. Inflow: Atherosclerotic calcifications in iliac arteries without significant stenosis. No aneurysm. Proximal Outflow: Bilateral common femoral and visualized portions of the superficial and profunda femoral arteries are patent without evidence of aneurysm, dissection, vasculitis or significant stenosis. Veins: Portal venous system is patent. Iliac veins, IVC and renal veins are patent. Review of the MIP images confirms the above findings. NON-VASCULAR Lower chest: There are coronary artery calcifications. There is a punctate calcified granuloma in the right middle lobe. Otherwise, the lung bases are clear. Hepatobiliary: Normal appearance of the liver, gallbladder and portal venous system. Pancreas: Normal appearance of the pancreas without inflammation or duct dilatation. Spleen: Normal appearance of spleen without enlargement. Adrenals/Urinary Tract: Normal adrenal glands. There may be a tiny stone in the left kidney interpolar region. There is no significant left hydronephrosis but there is a 3 mm calcification in the region of the distal left ureter. There is very minimal dilatation of the left ureter compared to the right ureter. Question minimal stranding around the mid left ureter on sequence 7, image 172. No definite right kidney stone or right hydronephrosis. Stomach/Bowel: Normal appearance of the stomach. No evidence for bowel dilatation or focal inflammation. Lymphatic: Small lymph nodes in  the periaortic region. Overall, no significant lymph node enlargement in the abdomen or pelvis. Reproductive: Prostate is unremarkable. Other: Small left inguinal hernia containing fat. A tiny umbilical hernia [id number] fat. Musculoskeletal: No acute bone abnormality. Severe disc space narrowing at L5-S1. IMPRESSION: VASCULAR Negative for aortic aneurysm or dissection. Coronary artery calcifications. NON-VASCULAR Minimal dilatation of left ureter secondary to a 3 mm stone in the distal left ureter. This is probably the cause of the left flank pain. In addition, there may be another tiny left kidney stone. Limited evaluation for kidney stones on this postcontrast examination. Electronically Signed   By: Markus Daft M.D.   On: 02/23/2016 09:52    ____________________________________________   PROCEDURES  Procedure(s) performed: None  Procedures  Critical Care performed: No  ____________________________________________   INITIAL IMPRESSION /  ASSESSMENT AND PLAN / ED COURSE  Pertinent labs & imaging results that were available during my care of the patient were reviewed by me and considered in my medical decision making (see chart for details).  Abrupt onset of severe left flank pain, now improving. Presently resting comfortably reports no pain. Appears no cardiac or pulmonary symptoms, pain was located in the left mid to lower back and flank and then radiate down towards the left lower quadrant. Suspicious for possible etiologies including kidney stone, but also given his previous history of vascular etiology such as dissection or aneurysm are also considered. No evidence of infectious etiology. No red flags which would predispose the patient to spinal infection, elevated risk for spinal mass, tumor, and he reports no neurologic symptoms. ----------------------------------------- 10:35 AM on 02/23/2016 -----------------------------------------  I will prescribe the patient a narcotic pain  medicine due to their condition which I anticipate will cause at least moderate pain short term. I discussed with the patient safe use of narcotic pain medicines, and that they are not to drive, work in dangerous areas, or ever take more than prescribed (no more than 1 pill every 6 hours). We discussed that this is the type of medication that can be  overdosed on and the risks of this type of medicine. Patient is very agreeable to only use as prescribed and to never use more than prescribed.  Return precautions and treatment recommendations and follow-up discussed with the patient who is agreeable with the plan.    Clinical Course     ____________________________________________   FINAL CLINICAL IMPRESSION(S) / ED DIAGNOSES  Final diagnoses:  Kidney stone on left side      NEW MEDICATIONS STARTED DURING THIS VISIT:  New Prescriptions   HYDROCODONE-ACETAMINOPHEN (NORCO/VICODIN) 5-325 MG TABLET    Take 1-2 tablets by mouth every 6 (six) hours as needed for moderate pain.   ONDANSETRON (ZOFRAN ODT) 4 MG DISINTEGRATING TABLET    Take 1 tablet (4 mg total) by mouth every 6 (six) hours as needed for nausea or vomiting.   TAMSULOSIN (FLOMAX) 0.4 MG CAPS CAPSULE    Take 1 capsule (0.4 mg total) by mouth daily.     Note:  This document was prepared using Dragon voice recognition software and may include unintentional dictation errors.     Delman Kitten, MD 02/23/16 [phone]

## 2016-02-23 NOTE — ED Triage Notes (Addendum)
Patient ambulatory to triage with steady gait, without difficulty or distress noted; pt reports left lower back pain this morning, nonradiating, with no accomp symptoms; denies any known injury, denies any hx of same; st took 2 aleve PTA with improvement

## 2016-02-23 NOTE — ED Notes (Signed)
Pt ambulatory to lobby. NAD noted.

## 2016-02-23 NOTE — Discharge Instructions (Signed)
You have been seen in the Emergency Department (ED) today for pain that we believe based on your workup, is caused by kidney stones.  As we have discussed, please drink plenty of fluids.  Please make a follow up appointment with the physician(s) listed elsewhere in this documentation.  You may take pain medication as needed but ONLY as prescribed.  Please also take your prescribed Flomax daily.  We also recommend that you take over-the-counter ibuprofen regularly according to label instructions over the next 5 days.  Take it with meals to minimize stomach discomfort.  Please see your doctor as soon as possible as stones may take 1-3 weeks to pass and you may require additional care or medications.  Do not drink alcohol, drive or participate in any other potentially dangerous activities while taking opiate pain medication as it may make you sleepy. Do not take this medication with any other sedating medications, either prescription or over-the-counter. If you were prescribed Percocet or Vicodin, do not take these with acetaminophen (Tylenol) as it is already contained within these medications.   This medication is an opiate (or narcotic) pain medication and can be habit forming.  Use it as little as possible to achieve adequate pain control.  Do not use or use it with extreme caution if you have a history of opiate abuse or dependence.  If you are on a pain contract with your primary care doctor or a pain specialist, be sure to let them know you were prescribed this medication today from the [facility] Emergency Department.  This medication is intended for your use only - do not give any to anyone else and keep it in a secure place where nobody else, especially children, have access to it.  It will also cause or worsen constipation, so you may want to consider taking an over-the-counter stool softener while you are taking this medication.  Return to the Emergency Department (ED) or call your doctor  if you have any worsening pain, fever, painful urination, are unable to urinate, or develop other symptoms that concern you.

## 2016-02-23 NOTE — ED Notes (Signed)
Pt transported to CT ?

## 2016-06-13 ENCOUNTER — Ambulatory Visit (INDEPENDENT_AMBULATORY_CARE_PROVIDER_SITE_OTHER): Payer: Self-pay | Admitting: Physician Assistant

## 2016-06-13 ENCOUNTER — Encounter: Payer: Self-pay | Admitting: Physician Assistant

## 2016-06-13 VITALS — BP 112/70 | HR 92 | Ht 68.0 in | Wt 316.0 lb

## 2016-06-13 DIAGNOSIS — G4733 Obstructive sleep apnea (adult) (pediatric): Secondary | ICD-10-CM

## 2016-06-13 DIAGNOSIS — E785 Hyperlipidemia, unspecified: Secondary | ICD-10-CM

## 2016-06-13 DIAGNOSIS — IMO0001 Reserved for inherently not codable concepts without codable children: Secondary | ICD-10-CM

## 2016-06-13 DIAGNOSIS — I2510 Atherosclerotic heart disease of native coronary artery without angina pectoris: Secondary | ICD-10-CM

## 2016-06-13 DIAGNOSIS — F17200 Nicotine dependence, unspecified, uncomplicated: Secondary | ICD-10-CM

## 2016-06-13 MED ORDER — NITROGLYCERIN 0.4 MG SL SUBL: 0.4000 mg | SUBLINGUAL_TABLET | SUBLINGUAL | 6 refills | Status: DC | PRN

## 2016-06-13 NOTE — Patient Instructions (Addendum)
Medication Instructions:  A REFILL FOR NITROGLYCERIN HAS BEEN SENT IN  Labwork: 1. FASTING LIPID AND CMET TO BE DONE IN 1 WEEK  Testing/Procedures: NONE  Follow-Up: Your physician wants you to follow-up in: [place], [facility] You will receive a reminder letter in the mail two months in advance. If you don't receive a letter, please call our office to schedule the follow-up appointment.  Any Other Special Instructions Will Be Listed Below (If Applicable).  If you need a refill on your cardiac medications before your next appointment, please call your pharmacy.

## 2016-06-13 NOTE — Progress Notes (Signed)
Cardiology Office Note:    Date:  06/13/2016   ID:  Raymond Humphrey, DOB 08-Oct-1960, MRN [id number]  PCP:  No PCP Per Patient  Cardiologist:  Dr. Loralie Champagne   Electrophysiologist:  n/a  Referring MD: No ref. provider found   Chief Complaint  Patient presents with  . Follow-up    CAD    History of Present Illness:    Raymond Humphrey is a 56 y.o. male with a hx of CAD, OSA, and obesity. He had an MI in 1/09 in [place]. At that time, he had BMS x 2 to the RCA.  Echo in our office in 2012 showed EF 55%, basal to mid inferior hypokinesis, grade II diastolic dysfunction, normal RV, and no evidence for pulmonary hypertension. Myoview in 2013 in our office demonstrated no ischemia.  Last seen in 2016.  A follow up ETT at that time was low risk.    He returns for follow up.  He did move to [place] for 1 year.  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  This was done in [place], [place] in 3/17 and demonstrated EF 55%, no RWMA, mild diastolic dysfunction, mild LVH, trivial MR, trivial TR.  Today, he denies chest pain, shortness of breath, syncope, orthopnea, PND or significant pedal edema.   Prior CV studies:   The following studies were reviewed today:  ETT 2/16 ETT Interpretation: normal - no evidence of ischemia by ST analysis  Nuclear Stress Test (01/2012):   diaph atten, no ischemia, EF 56%  Echocardiogram (01/28/11):  EF 55%, inf mild HK, Gr 2 DD, mild LAE, mild RAE  LHC (1/09):   OM 30%, RCA occluded >> BMS x 2 to RCA (done in [place])   Past Medical History:  Diagnosis Date  . Coronary artery disease    CAD: MI in 1/09 in [place]. Had BMS x 2 to the RCA at that time. 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 Nuclear Stress Test (01/2012):  diaph atten, no ischemia, EF 56%.    Marland Kitchen Hx of cardiovascular stress test    GXT (2/16): no ischemic EKG changes  . Hyperlipidemia   .  Hypertension   . Mild pulmonic regurgitation and RV dilation by prior echocardiogram    Dilated RV on 2009 echo. However, echo (10/12) showed EF 55%, basal to mid inferior hypokinesis, grade II diastolic dysfunction, normal RV size and systolic function, PA systolic pressure;  Echocardiogram (01/28/11):  EF 55%, inf mild HK, Gr 2 DD, mild LAE, mild RAE  . Obesity   . OSA (obstructive sleep apnea)    on CPAP  . Smoking   1. Obesity 2. OSA: on CPAP.  3. CAD: MI in 1/09 in [place]. Had BMS x 2 to the RCA at that time. 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 Nuclear Stress Test (01/2012):  diaph atten, no ischemia, EF 56%.   4. Hyperlipidemia 5. Active smoker 6. Dilated RV on 2009 echo. However, echo (10/12) showed EF 55%, basal to mid inferior hypokinesis, grade II diastolic dysfunction, normal RV size and systolic function, PA systolic pressure;  Echocardiogram (01/28/11):  EF 55%, inf mild HK, Gr 2 DD, mild LAE, mild RAE  Past Surgical History:  Procedure Laterality Date  . APPENDECTOMY    . PERCUTANEOUS CORONARY STENT INTERVENTION (PCI-S)  2009   BMS  x 2 to RCA - hospital in [place]     Current Medications: Current Meds  Medication Sig  . aspirin EC 81 MG tablet Take 1 tablet (81 mg total) by mouth daily.  Marland Kitchen atorvastatin (LIPITOR) 40 MG tablet Take 1 tablet (40 mg total) by mouth daily.  Marland Kitchen lisinopril (PRINIVIL,ZESTRIL) 10 MG tablet Take 1 tablet (10 mg total) by mouth daily.     Allergies:   Patient has no known allergies.   Social History   Social History  . Marital status: Married    Spouse name: N/A  . Number of children: N/A  . Years of education: N/A   Occupational History  . [employer] [place] [employer]   Social History Main Topics  . Smoking status: Current Every Day Smoker    Packs/day: 0.50    Types: Cigarettes  . Smokeless tobacco: Never Used  . Alcohol use None     Comment: 6pack a week when  home  . Drug use: No  . Sexual activity: Not Asked   Other Topics Concern  . None   Social History Narrative  . None     Family History  Problem Relation Age of Onset  . Heart attack Father   . Heart attack Cousin   . Heart attack Maternal Grandfather      ROS:   Please see the history of present illness.    ROS All other systems reviewed and are negative.   EKGs/Labs/Other Test Reviewed:    EKG:  EKG is  ordered today.  The ekg ordered today demonstrates NSR, HR 92, normal axis, QTc 413 ms, no changes.  Recent Labs: 02/23/2016: ALT 41; BUN 20; Creatinine, Ser 1.36; Hemoglobin 16.7; Platelets 226; Potassium 4.4; Sodium 136   Recent Lipid Panel    Component Value Date/Time   CHOL 183 06/23/2014 1120   TRIG 125.0 06/23/2014 1120   HDL 44.70 06/23/2014 1120   CHOLHDL 4 06/23/2014 1120   VLDL 25.0 06/23/2014 1120   LDLCALC 113 (H) 06/23/2014 1120     Physical Exam:    VS:  BP 112/70   Pulse 92   Ht 5\' 8"  (1.727 m)   Wt (!) 316 lb (143.3 kg)   BMI 48.05 kg/m     Wt Readings from Last 3 Encounters:  06/13/16 (!) 316 lb (143.3 kg)  02/23/16 (!) 310 lb (140.6 kg)  10/01/14 284 lb (128.8 kg)     Physical Exam  Constitutional: He is oriented to person, place, and time. He appears well-developed and well-nourished. No distress.  HENT:  Head: Normocephalic and atraumatic.  Eyes: No scleral icterus.  Neck: Normal range of motion. No JVD present. Carotid bruit is not present.  Cardiovascular: Normal rate, regular rhythm, S1 normal and S2 normal.   No murmur heard. Pulmonary/Chest: Effort normal and breath sounds normal. He has no wheezes. He has no rhonchi. He has no rales.  Abdominal: Soft. There is no tenderness.  Musculoskeletal: He exhibits no edema.  Neurological: He is alert and oriented to person, place, and time.  Skin: Skin is warm and dry.  Psychiatric: He has a normal mood and affect.    ASSESSMENT:    1. Coronary artery disease involving native  coronary artery of native heart without angina pectoris   2. Hyperlipidemia, unspecified hyperlipidemia type   3. Smoking   4. OSA (obstructive sleep apnea)   5. Morbid obesity ([place])    PLAN:    In order of problems listed above:  1.  CAD - s/p prior MI tx with BMS x 2 to the RCA in 2009 (in [place]).  Myoview in 2013 was low risk and ETT in 2016 was low risk.  He brought in documentation of an echo done in [place] in 3/17 that demonstrated normal ejection fraction  He denies angina.  Continue ASA, statin.  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  He does not need a stress test at this time.  2. HL - LDL in 2/16 was 113.  Continue statin. Arrange FU CMET, Lipids.  3. Tobacco abuse - He is trying to quit.  4. OSA - Continue CPAP.  5. Obesity - He is trying to lose weight.    Medication Adjustments/Labs and Tests Ordered: Current medicines are reviewed at length with the patient today.  Concerns regarding medicines are outlined above.  Medication changes, Labs and Tests ordered today are outlined in the Patient Instructions noted below. Patient Instructions  Medication Instructions:  A REFILL FOR NITROGLYCERIN HAS BEEN SENT IN  Labwork: 1. FASTING LIPID AND CMET TO BE DONE IN 1 WEEK  Testing/Procedures: NONE  Follow-Up: Your physician wants you to follow-up in: [place], [facility] You will receive a reminder letter in the mail two months in advance. If you don't receive a letter, please call our office to schedule the follow-up appointment.  Any Other Special Instructions Will Be Listed Below (If Applicable).  If you need a refill on your cardiac medications before your next appointment, please call your pharmacy.   Return in about 6 months (around 12/11/2016) for Routine Follow Up, w/ Richardson Dopp, PA-C.   Signed, Richardson Dopp, PA-C  06/13/2016 4:16 PM    [facility] [address], [place], [place]   [address] Phone: [phone]; Fax: [phone]

## 2016-06-27 ENCOUNTER — Other Ambulatory Visit: Payer: Self-pay

## 2016-09-05 DIAGNOSIS — I209 Angina pectoris, unspecified: Secondary | ICD-10-CM | POA: Diagnosis not present

## 2016-09-05 DIAGNOSIS — Z7189 Other specified counseling: Secondary | ICD-10-CM | POA: Diagnosis not present

## 2016-09-05 DIAGNOSIS — G4733 Obstructive sleep apnea (adult) (pediatric): Secondary | ICD-10-CM | POA: Diagnosis not present

## 2016-09-12 DIAGNOSIS — M79674 Pain in right toe(s): Secondary | ICD-10-CM | POA: Diagnosis not present

## 2016-09-12 DIAGNOSIS — B351 Tinea unguium: Secondary | ICD-10-CM | POA: Diagnosis not present

## 2016-09-12 DIAGNOSIS — L600 Ingrowing nail: Secondary | ICD-10-CM | POA: Diagnosis not present

## 2016-10-24 DIAGNOSIS — G4733 Obstructive sleep apnea (adult) (pediatric): Secondary | ICD-10-CM | POA: Diagnosis not present

## 2016-11-22 DIAGNOSIS — G4733 Obstructive sleep apnea (adult) (pediatric): Secondary | ICD-10-CM | POA: Diagnosis not present

## 2016-12-23 DIAGNOSIS — G4733 Obstructive sleep apnea (adult) (pediatric): Secondary | ICD-10-CM | POA: Diagnosis not present

## 2017-01-16 DIAGNOSIS — E784 Other hyperlipidemia: Secondary | ICD-10-CM | POA: Diagnosis not present

## 2017-01-16 DIAGNOSIS — R5381 Other malaise: Secondary | ICD-10-CM | POA: Diagnosis not present

## 2017-01-16 DIAGNOSIS — I10 Essential (primary) hypertension: Secondary | ICD-10-CM | POA: Diagnosis not present

## 2017-01-16 DIAGNOSIS — G4733 Obstructive sleep apnea (adult) (pediatric): Secondary | ICD-10-CM | POA: Diagnosis not present

## 2017-01-22 DIAGNOSIS — G4733 Obstructive sleep apnea (adult) (pediatric): Secondary | ICD-10-CM | POA: Diagnosis not present

## 2017-01-30 DIAGNOSIS — B079 Viral wart, unspecified: Secondary | ICD-10-CM | POA: Diagnosis not present

## 2017-01-30 DIAGNOSIS — G4730 Sleep apnea, unspecified: Secondary | ICD-10-CM | POA: Diagnosis not present

## 2017-01-31 DIAGNOSIS — E7849 Other hyperlipidemia: Secondary | ICD-10-CM | POA: Diagnosis not present

## 2017-01-31 DIAGNOSIS — I10 Essential (primary) hypertension: Secondary | ICD-10-CM | POA: Diagnosis not present

## 2017-01-31 DIAGNOSIS — Z125 Encounter for screening for malignant neoplasm of prostate: Secondary | ICD-10-CM | POA: Diagnosis not present

## 2017-01-31 DIAGNOSIS — R5381 Other malaise: Secondary | ICD-10-CM | POA: Diagnosis not present

## 2017-02-22 DIAGNOSIS — G4733 Obstructive sleep apnea (adult) (pediatric): Secondary | ICD-10-CM | POA: Diagnosis not present

## 2017-03-06 DIAGNOSIS — I2510 Atherosclerotic heart disease of native coronary artery without angina pectoris: Secondary | ICD-10-CM | POA: Diagnosis not present

## 2017-03-06 DIAGNOSIS — G4730 Sleep apnea, unspecified: Secondary | ICD-10-CM | POA: Diagnosis not present

## 2017-03-06 DIAGNOSIS — I119 Hypertensive heart disease without heart failure: Secondary | ICD-10-CM | POA: Diagnosis not present

## 2017-03-20 DIAGNOSIS — I2510 Atherosclerotic heart disease of native coronary artery without angina pectoris: Secondary | ICD-10-CM | POA: Diagnosis not present

## 2017-03-20 DIAGNOSIS — G4730 Sleep apnea, unspecified: Secondary | ICD-10-CM | POA: Diagnosis not present

## 2017-03-24 DIAGNOSIS — G4733 Obstructive sleep apnea (adult) (pediatric): Secondary | ICD-10-CM | POA: Diagnosis not present

## 2017-03-30 DIAGNOSIS — G4733 Obstructive sleep apnea (adult) (pediatric): Secondary | ICD-10-CM | POA: Diagnosis not present

## 2017-04-24 DIAGNOSIS — G4733 Obstructive sleep apnea (adult) (pediatric): Secondary | ICD-10-CM | POA: Diagnosis not present

## 2017-05-25 DIAGNOSIS — G4733 Obstructive sleep apnea (adult) (pediatric): Secondary | ICD-10-CM | POA: Diagnosis not present

## 2017-06-22 DIAGNOSIS — G4733 Obstructive sleep apnea (adult) (pediatric): Secondary | ICD-10-CM | POA: Diagnosis not present

## 2017-07-14 DIAGNOSIS — G4733 Obstructive sleep apnea (adult) (pediatric): Secondary | ICD-10-CM | POA: Diagnosis not present

## 2017-07-23 DIAGNOSIS — G4733 Obstructive sleep apnea (adult) (pediatric): Secondary | ICD-10-CM | POA: Diagnosis not present

## 2017-08-22 DIAGNOSIS — G4733 Obstructive sleep apnea (adult) (pediatric): Secondary | ICD-10-CM | POA: Diagnosis not present

## 2017-09-08 ENCOUNTER — Other Ambulatory Visit: Payer: Self-pay

## 2017-09-08 ENCOUNTER — Encounter: Payer: Self-pay | Admitting: Emergency Medicine

## 2017-09-08 DIAGNOSIS — K9189 Other postprocedural complications and disorders of digestive system: Secondary | ICD-10-CM | POA: Insufficient documentation

## 2017-09-08 DIAGNOSIS — K068 Other specified disorders of gingiva and edentulous alveolar ridge: Secondary | ICD-10-CM | POA: Diagnosis not present

## 2017-09-08 DIAGNOSIS — F17210 Nicotine dependence, cigarettes, uncomplicated: Secondary | ICD-10-CM | POA: Insufficient documentation

## 2017-09-08 DIAGNOSIS — I10 Essential (primary) hypertension: Secondary | ICD-10-CM | POA: Insufficient documentation

## 2017-09-08 DIAGNOSIS — Z7982 Long term (current) use of aspirin: Secondary | ICD-10-CM | POA: Insufficient documentation

## 2017-09-08 DIAGNOSIS — Z79899 Other long term (current) drug therapy: Secondary | ICD-10-CM | POA: Insufficient documentation

## 2017-09-08 DIAGNOSIS — I259 Chronic ischemic heart disease, unspecified: Secondary | ICD-10-CM | POA: Diagnosis not present

## 2017-09-08 NOTE — ED Triage Notes (Signed)
Pt arrives ambulatory to triage with c/o oral bleeding. Pt was fitted with dentures today at 1400 and has not stopped bleeding. Pt had several teeth pulled and is having clots coming out of the upper left side of mouth. Pt is in NAD.

## 2017-09-09 ENCOUNTER — Emergency Department
Admission: EM | Admit: 2017-09-09 | Discharge: 2017-09-09 | Disposition: A | Discharge status: Other Acute Inpatient Hospital | Payer: 59 | Attending: Emergency Medicine | Admitting: Emergency Medicine

## 2017-09-09 DIAGNOSIS — K1379 Other lesions of oral mucosa: Secondary | ICD-10-CM | POA: Diagnosis not present

## 2017-09-09 DIAGNOSIS — Z9889 Other specified postprocedural states: Secondary | ICD-10-CM | POA: Diagnosis not present

## 2017-09-09 DIAGNOSIS — R58 Hemorrhage, not elsewhere classified: Secondary | ICD-10-CM | POA: Diagnosis not present

## 2017-09-09 DIAGNOSIS — K068 Other specified disorders of gingiva and edentulous alveolar ridge: Secondary | ICD-10-CM

## 2017-09-09 DIAGNOSIS — M7981 Nontraumatic hematoma of soft tissue: Secondary | ICD-10-CM | POA: Diagnosis not present

## 2017-09-09 DIAGNOSIS — I2510 Atherosclerotic heart disease of native coronary artery without angina pectoris: Secondary | ICD-10-CM | POA: Diagnosis not present

## 2017-09-09 DIAGNOSIS — K0889 Other specified disorders of teeth and supporting structures: Secondary | ICD-10-CM | POA: Diagnosis not present

## 2017-09-09 LAB — CBC
HCT: 44.1 % (ref 40.0–52.0)
HEMOGLOBIN: 15 g/dL (ref 13.0–18.0)
MCH: 30.5 pg (ref 26.0–34.0)
MCHC: 34.1 g/dL (ref 32.0–36.0)
MCV: 89.4 fL (ref 80.0–100.0)
PLATELETS: 272 10*3/uL (ref 150–440)
RBC: 4.93 MIL/uL (ref 4.40–5.90)
RDW: 13.6 % (ref 11.5–14.5)
WBC: 14.8 10*3/uL — AB (ref 3.8–10.6)

## 2017-09-09 LAB — GLUCOSE, CAPILLARY: Glucose-Capillary: 131 mg/dL — ABNORMAL HIGH (ref 65–99)

## 2017-09-09 MED ORDER — ONDANSETRON HCL 4 MG/2ML IJ SOLN
INTRAMUSCULAR | Status: AC
  Filled 2017-09-09: qty 2

## 2017-09-09 MED ORDER — SODIUM CHLORIDE 0.9 % IV BOLUS
1000.0000 mL | Freq: Once | INTRAVENOUS | Status: AC
  Administered 2017-09-09: 1000 mL via INTRAVENOUS

## 2017-09-09 MED ORDER — ONDANSETRON HCL 4 MG/2ML IJ SOLN
4.0000 mg | Freq: Once | INTRAMUSCULAR | Status: AC
  Administered 2017-09-09: 4 mg via INTRAVENOUS

## 2017-09-09 MED ORDER — TRANEXAMIC ACID 1000 MG/10ML IV SOLN
500.0000 mg | Freq: Once | INTRAVENOUS | Status: AC
  Administered 2017-09-09: 500 mg via TOPICAL
  Filled 2017-09-09 (×2): qty 10

## 2017-09-09 NOTE — ED Notes (Addendum)
Assessment: pt with 15 teeth pulled today to fit for dentures. Pt currently has lower dentures in place. Pt with bleeding noted to left lower jaw. Pt states "it has not stopped since I got them pulled". Pt with pwd skin. resps unlabored. Pt's spouse states he "has been through a whole pack of gauze" but unknown number or gauze per pt and family.

## 2017-09-09 NOTE — ED Notes (Signed)
Pt continues to ooze blood from left lower jaw. Pt encouraged to keep gauze packing in place.

## 2017-09-09 NOTE — ED Provider Notes (Signed)
[facility] Emergency Department Provider Note   ____________________________________________   First MD Initiated Contact with Patient 09/09/17 0126     (approximate)  I have reviewed the triage vital signs and the nursing notes.   HISTORY  Chief Complaint Dental Injury    HPI Raymond Humphrey is a 57 y.o. male who comes into the hospital today with some bleeding from his gums.  The patient states that he had 16 teeth pulled today.  It was at a 1 dental around 1 PM.  The patient reports that since then he has been bleeding.  He states that he is bleeding from his left lower jaw.  He states that he called back to the office and they told him that it was expected that he would bleed but he states that he swallowed so much but he cannot keep any food or fluid down.  The patient also has some large clots in his mouth.  He reports that he is been awake for 36 hours and is tired.  The patient has dentures in place and was told to keep them over the areas.  The patient tried putting tea bags over the area and it did not help.  The patient denies taking any blood thinners including aspirin or Plavix.  The patient is here for evaluation.   Past Medical History:  Diagnosis Date  . Coronary artery disease    CAD: MI in 1/09 in [place]. Had BMS x 2 to the RCA at that time. 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 Nuclear Stress Test (01/2012):  diaph atten, no ischemia, EF 56%.    Marland Kitchen Hx of cardiovascular stress test    GXT (2/16): no ischemic EKG changes  . Hyperlipidemia   . Hypertension   . Mild pulmonic regurgitation and RV dilation by prior echocardiogram    Dilated RV on 2009 echo. However, echo (10/12) showed EF 55%, basal to mid inferior hypokinesis, grade II diastolic dysfunction, normal RV size and systolic function, PA systolic pressure;  Echocardiogram (01/28/11):  EF 55%, inf mild HK,  Gr 2 DD, mild LAE, mild RAE  . Obesity   . OSA (obstructive sleep apnea)    on CPAP  . Smoking     Patient Active Problem List   Diagnosis Date Noted  . Morbid obesity ([place]) 06/13/2016  . Coronary artery disease 01/04/2011  . RVF (right ventricular failure) ([place]) 01/04/2011  . Hyperlipidemia 01/04/2011  . OSA (obstructive sleep apnea) 01/04/2011  . Smoking 01/04/2011    Past Surgical History:  Procedure Laterality Date  . APPENDECTOMY    . PERCUTANEOUS CORONARY STENT INTERVENTION (PCI-S)  2009   BMS x 2 to RCA - hospital in [place]     Prior to Admission medications   Medication Sig Start Date End Date Taking? Authorizing Provider  aspirin EC 81 MG tablet Take 1 tablet (81 mg total) by mouth daily. 02/07/12   Larey Dresser, MD  atorvastatin (LIPITOR) 40 MG tablet Take 1 tablet (40 mg total) by mouth daily. 06/27/14   Richardson Dopp T, PA-C  lisinopril (PRINIVIL,ZESTRIL) 10 MG tablet Take 1 tablet (10 mg total) by mouth daily. 06/19/14   Larey Dresser, MD  nitroGLYCERIN (NITROSTAT) 0.4 MG SL tablet Place 1 tablet (0.4 mg total) under the tongue every 5 (five) minutes as needed for chest pain. 06/13/16 09/11/16  Richardson Dopp T, PA-C    Allergies Patient has  no known allergies.  Family History  Problem Relation Age of Onset  . Heart attack Father   . Heart attack Cousin   . Heart attack Maternal Grandfather     Social History Social History   Tobacco Use  . Smoking status: Current Every Day Smoker    Packs/day: 0.50    Types: Cigarettes  . Smokeless tobacco: Never Used  Substance Use Topics  . Alcohol use: Yes    Comment: 6pack a week when home  . Drug use: No    Review of Systems  Constitutional: No fever/chills Eyes: No visual changes. ENT: Bleeding gums. Cardiovascular: Denies chest pain. Respiratory: Denies shortness of breath. Gastrointestinal: No abdominal pain.  No nausea, no vomiting.  No diarrhea.  No constipation. Genitourinary: Negative for  dysuria. Musculoskeletal: Negative for back pain. Skin: Negative for rash. Neurological: Negative for headaches, focal weakness or numbness.   ____________________________________________   PHYSICAL EXAM:  VITAL SIGNS: ED Triage Vitals  Enc Vitals Group     BP 09/08/17 2226 138/82     Pulse Rate 09/08/17 2226 (!) 103     Resp 09/08/17 2226 (!) 22     Temp 09/08/17 2226 99.3 F (37.4 C)     Temp Source 09/08/17 2226 Oral     SpO2 09/08/17 2226 95 %     Weight 09/08/17 2225 300 lb (136.1 kg)     Height 09/08/17 2225 5\' 8"  (1.727 m)     Head Circumference --      Peak Flow --      Pain Score 09/08/17 2225 4     Pain Loc --      Pain Edu? --      Excl. in [place]? --     Constitutional: Alert and oriented. Well appearing and in moderate distress. Eyes: Conjunctivae are normal. PERRL. EOMI. Head: Atraumatic. Nose: No congestion/rhinnorhea. Mouth/Throat: Mucous membranes are moist.  Oropharynx non-erythematous.  Blood in mouth with large blood clot to left lower jaw Cardiovascular: Normal rate, regular rhythm. Grossly normal heart sounds.  Good peripheral circulation. Respiratory: Normal respiratory effort.  No retractions. Lungs CTAB. Gastrointestinal: Soft and nontender. No distention.  Positive bowel sounds Musculoskeletal: No lower extremity tenderness nor edema.   Neurologic:  Normal speech and language.  Skin:  Skin is warm, dry and intact.  Psychiatric: Mood and affect are normal.   ____________________________________________   LABS (all labs ordered are listed, but only abnormal results are displayed)  Labs Reviewed  CBC - Abnormal; Notable for the following components:      Result Value   WBC 14.8 (*)    All other components within normal limits  GLUCOSE, CAPILLARY - Abnormal; Notable for the following components:   Glucose-Capillary 131 (*)    All other components within normal limits  CBG MONITORING, ED    ____________________________________________  EKG  none ____________________________________________  RADIOLOGY  ED MD interpretation:  none  Official radiology report(s): No results found.  ____________________________________________   PROCEDURES  Procedure(s) performed: None  Procedures  Critical Care performed: No  ____________________________________________   INITIAL IMPRESSION / ASSESSMENT AND PLAN / ED COURSE  As part of my medical decision making, I reviewed the following data within the electronic MEDICAL RECORD NUMBER Notes from prior ED visits and [place] Controlled Substance Database   This is a 57 year old male who comes into the hospital today with some bleeding at his gums.   I initially attempted to stanch the bleeding by placing a TXA soaked gauze in the  patient's mouth.  The patient had bleeding around the area and the gauze did not help.  The patient did develop a clot in his mouth that was large like previous but he continued to bleed.  I then attempted to place some Surgicel in the patient's mouth but again he did not tolerate that as well.  I did attempt to stop the bleeding by placing some gauze in the patient's mouth but could not see the base of the socket.  Given the extent of bleeding I did attempt to get in touch with her dentist at [facility].  I spoke with an endodontist who felt that the patient needed an oral surgeon.  I then spoke to Dr. Patrina Levering at [facility] who was OMFS and he recommended sending the patient to the ED at [facility] to be seen by dental surgery.  The patient will be transferred to [facility].      ____________________________________________   FINAL CLINICAL IMPRESSION(S) / ED DIAGNOSES  Final diagnoses:  Bleeding gums     ED Discharge Orders    None       Note:  This document was prepared using Dragon voice recognition software and may include unintentional dictation errors.    Loney Hering, MD 09/09/17 [phone]

## 2017-09-09 NOTE — ED Notes (Signed)
Medical Necessity and EMTALA documentation reviewed at this time and noted to be complete per policy. 

## 2017-09-09 NOTE — ED Notes (Signed)
Previous gauze with medication applied left in place, fresh gauze placed over original gauze and pt encouraged to not to remove or mess the area. Pt encouraged to apply light pressure on gauze placed in left side of mouth.

## 2017-09-09 NOTE — ED Notes (Signed)
Ems here for transport

## 2017-09-09 NOTE — ED Notes (Signed)
Pt given additional gauze.  Instructed to leave gauze in mouth and not change out as often to help with stopping the bleeding.

## 2017-10-05 DIAGNOSIS — L918 Other hypertrophic disorders of the skin: Secondary | ICD-10-CM | POA: Diagnosis not present

## 2017-10-05 DIAGNOSIS — L821 Other seborrheic keratosis: Secondary | ICD-10-CM | POA: Diagnosis not present

## 2017-10-05 DIAGNOSIS — D485 Neoplasm of uncertain behavior of skin: Secondary | ICD-10-CM | POA: Diagnosis not present

## 2017-10-12 DIAGNOSIS — G4733 Obstructive sleep apnea (adult) (pediatric): Secondary | ICD-10-CM | POA: Diagnosis not present

## 2018-01-11 DIAGNOSIS — G4733 Obstructive sleep apnea (adult) (pediatric): Secondary | ICD-10-CM | POA: Diagnosis not present

## 2018-02-20 ENCOUNTER — Ambulatory Visit: Payer: 59 | Admitting: Podiatry

## 2018-02-20 ENCOUNTER — Ambulatory Visit (INDEPENDENT_AMBULATORY_CARE_PROVIDER_SITE_OTHER): Payer: 59

## 2018-02-20 ENCOUNTER — Encounter: Payer: Self-pay | Admitting: Podiatry

## 2018-02-20 ENCOUNTER — Other Ambulatory Visit: Payer: Self-pay | Admitting: Podiatry

## 2018-02-20 DIAGNOSIS — M7671 Peroneal tendinitis, right leg: Secondary | ICD-10-CM

## 2018-02-20 DIAGNOSIS — M25571 Pain in right ankle and joints of right foot: Secondary | ICD-10-CM

## 2018-02-21 NOTE — Progress Notes (Signed)
   HPI: 57 year old male presenting today as a new patient with a chief complaint of intermittent pain to the lateral right ankle that has been ongoing for about one year. He states the pain has worsened in the last week. He reports associated swelling. He states he has been working out lately and believes that has exacerbated the symptoms. He has not done anything for treatment. Patient is here for further evaluation and treatment.   Past Medical History:  Diagnosis Date  . Coronary artery disease    CAD: MI in 1/09 in [place]. Had BMS x 2 to the RCA at that time. 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 Nuclear Stress Test (01/2012):  diaph atten, no ischemia, EF 56%.    Marland Kitchen Hx of cardiovascular stress test    GXT (2/16): no ischemic EKG changes  . Hyperlipidemia   . Hypertension   . Mild pulmonic regurgitation and RV dilation by prior echocardiogram    Dilated RV on 2009 echo. However, echo (10/12) showed EF 55%, basal to mid inferior hypokinesis, grade II diastolic dysfunction, normal RV size and systolic function, PA systolic pressure;  Echocardiogram (01/28/11):  EF 55%, inf mild HK, Gr 2 DD, mild LAE, mild RAE  . Obesity   . OSA (obstructive sleep apnea)    on CPAP  . Smoking      Physical Exam: General: The patient is alert and oriented x3 in no acute distress.  Dermatology: Skin is warm, dry and supple bilateral lower extremities. Negative for open lesions or macerations.  Vascular: Palpable pedal pulses bilaterally. No edema or erythema noted. Capillary refill within normal limits.  Neurological: Epicritic and protective threshold grossly intact bilaterally.   Musculoskeletal Exam: Pain with palpation to the peroneal tendon of the right foot. Range of motion within normal limits to all pedal and ankle joints bilateral. Muscle strength 5/5 in all groups bilateral.   Radiographic Exam:  Normal osseous  mineralization. Joint spaces preserved. No fracture/dislocation/boney destruction.    Assessment: 1. Peroneal tendinitis right    Plan of Care:  1. Patient evaluated. X-Rays reviewed.  2. Injection of 0.5 mLs Celestone Soluspan injected into the peroneal tendon sheath.  3. Ankle brace dispensed.  4. Begin taking Meloxicam 15 mg at home.  5. Return to clinic in 4 weeks.      Edrick Kins, DPM [facility]  Dr. Edrick Kins, DPM    [address],  [address]                Office [phone]  Fax [phone]

## 2018-02-26 DIAGNOSIS — G4730 Sleep apnea, unspecified: Secondary | ICD-10-CM | POA: Diagnosis not present

## 2018-02-26 DIAGNOSIS — E785 Hyperlipidemia, unspecified: Secondary | ICD-10-CM | POA: Diagnosis not present

## 2018-05-02 DIAGNOSIS — G4733 Obstructive sleep apnea (adult) (pediatric): Secondary | ICD-10-CM | POA: Diagnosis not present

## 2018-05-17 DIAGNOSIS — H43811 Vitreous degeneration, right eye: Secondary | ICD-10-CM | POA: Diagnosis not present

## 2018-05-21 DIAGNOSIS — R42 Dizziness and giddiness: Secondary | ICD-10-CM | POA: Diagnosis not present

## 2018-05-21 DIAGNOSIS — I10 Essential (primary) hypertension: Secondary | ICD-10-CM | POA: Diagnosis not present

## 2018-05-21 DIAGNOSIS — E785 Hyperlipidemia, unspecified: Secondary | ICD-10-CM | POA: Diagnosis not present

## 2018-05-21 DIAGNOSIS — H8110 Benign paroxysmal vertigo, unspecified ear: Secondary | ICD-10-CM | POA: Diagnosis not present

## 2018-05-21 DIAGNOSIS — H903 Sensorineural hearing loss, bilateral: Secondary | ICD-10-CM | POA: Diagnosis not present

## 2018-05-22 DIAGNOSIS — R5381 Other malaise: Secondary | ICD-10-CM | POA: Diagnosis not present

## 2018-05-22 DIAGNOSIS — I10 Essential (primary) hypertension: Secondary | ICD-10-CM | POA: Diagnosis not present

## 2018-05-22 DIAGNOSIS — E7849 Other hyperlipidemia: Secondary | ICD-10-CM | POA: Diagnosis not present

## 2018-06-04 DIAGNOSIS — R062 Wheezing: Secondary | ICD-10-CM | POA: Diagnosis not present

## 2018-06-04 DIAGNOSIS — E785 Hyperlipidemia, unspecified: Secondary | ICD-10-CM | POA: Diagnosis not present

## 2018-06-04 DIAGNOSIS — I10 Essential (primary) hypertension: Secondary | ICD-10-CM | POA: Diagnosis not present

## 2018-06-09 DIAGNOSIS — R509 Fever, unspecified: Secondary | ICD-10-CM | POA: Diagnosis not present

## 2018-06-09 DIAGNOSIS — J111 Influenza due to unidentified influenza virus with other respiratory manifestations: Secondary | ICD-10-CM | POA: Diagnosis not present

## 2018-09-03 DIAGNOSIS — G4730 Sleep apnea, unspecified: Secondary | ICD-10-CM | POA: Diagnosis not present

## 2018-09-03 DIAGNOSIS — E785 Hyperlipidemia, unspecified: Secondary | ICD-10-CM | POA: Diagnosis not present

## 2018-09-12 DIAGNOSIS — R5381 Other malaise: Secondary | ICD-10-CM | POA: Diagnosis not present

## 2018-09-12 DIAGNOSIS — I10 Essential (primary) hypertension: Secondary | ICD-10-CM | POA: Diagnosis not present

## 2018-09-12 DIAGNOSIS — E7849 Other hyperlipidemia: Secondary | ICD-10-CM | POA: Diagnosis not present

## 2018-09-12 DIAGNOSIS — Z125 Encounter for screening for malignant neoplasm of prostate: Secondary | ICD-10-CM | POA: Diagnosis not present

## 2019-04-10 ENCOUNTER — Other Ambulatory Visit: Payer: Self-pay

## 2019-04-11 ENCOUNTER — Other Ambulatory Visit: Payer: Self-pay

## 2019-04-11 ENCOUNTER — Encounter: Payer: Self-pay | Admitting: Oncology

## 2019-04-11 ENCOUNTER — Inpatient Hospital Stay: Payer: 59 | Attending: Oncology | Admitting: Oncology

## 2019-04-11 ENCOUNTER — Inpatient Hospital Stay: Payer: 59

## 2019-04-11 VITALS — BP 145/89 | HR 90 | Temp 99.0°F | Ht 68.0 in | Wt 292.0 lb

## 2019-04-11 DIAGNOSIS — F17210 Nicotine dependence, cigarettes, uncomplicated: Secondary | ICD-10-CM | POA: Diagnosis not present

## 2019-04-11 DIAGNOSIS — Z79899 Other long term (current) drug therapy: Secondary | ICD-10-CM | POA: Insufficient documentation

## 2019-04-11 DIAGNOSIS — G4733 Obstructive sleep apnea (adult) (pediatric): Secondary | ICD-10-CM | POA: Diagnosis not present

## 2019-04-11 DIAGNOSIS — Z7982 Long term (current) use of aspirin: Secondary | ICD-10-CM | POA: Insufficient documentation

## 2019-04-11 DIAGNOSIS — Z8249 Family history of ischemic heart disease and other diseases of the circulatory system: Secondary | ICD-10-CM | POA: Diagnosis not present

## 2019-04-11 DIAGNOSIS — E785 Hyperlipidemia, unspecified: Secondary | ICD-10-CM | POA: Diagnosis not present

## 2019-04-11 DIAGNOSIS — D751 Secondary polycythemia: Secondary | ICD-10-CM

## 2019-04-11 LAB — CBC WITH DIFFERENTIAL/PLATELET
Abs Immature Granulocytes: 0.03 10*3/uL (ref 0.00–0.07)
Basophils Absolute: 0.1 10*3/uL (ref 0.0–0.1)
Basophils Relative: 1 %
Eosinophils Absolute: 0.2 10*3/uL (ref 0.0–0.5)
Eosinophils Relative: 2 %
HCT: 51.5 % (ref 39.0–52.0)
Hemoglobin: 17.1 g/dL — ABNORMAL HIGH (ref 13.0–17.0)
Immature Granulocytes: 0 %
Lymphocytes Relative: 22 %
Lymphs Abs: 1.8 10*3/uL (ref 0.7–4.0)
MCH: 29.7 pg (ref 26.0–34.0)
MCHC: 33.2 g/dL (ref 30.0–36.0)
MCV: 89.4 fL (ref 80.0–100.0)
Monocytes Absolute: 0.5 10*3/uL (ref 0.1–1.0)
Monocytes Relative: 6 %
Neutro Abs: 5.4 10*3/uL (ref 1.7–7.7)
Neutrophils Relative %: 69 %
Platelets: 216 10*3/uL (ref 150–400)
RBC: 5.76 MIL/uL (ref 4.22–5.81)
RDW: 12.3 % (ref 11.5–15.5)
WBC: 7.8 10*3/uL (ref 4.0–10.5)
nRBC: 0 % (ref 0.0–0.2)

## 2019-04-11 LAB — URINALYSIS, COMPLETE (UACMP) WITH MICROSCOPIC
Bacteria, UA: NONE SEEN
Bilirubin Urine: NEGATIVE
Glucose, UA: NEGATIVE mg/dL
Ketones, ur: NEGATIVE mg/dL
Nitrite: NEGATIVE
Protein, ur: NEGATIVE mg/dL
Specific Gravity, Urine: 1.02 (ref 1.005–1.030)
Squamous Epithelial / LPF: NONE SEEN (ref 0–5)
pH: 6 (ref 5.0–8.0)

## 2019-04-11 NOTE — Progress Notes (Signed)
Patient is here today to establish care for secondary polycythemia.

## 2019-04-11 NOTE — Progress Notes (Signed)
Hematology/Oncology Consult note [facility] Telephone:([phone] [phone]  Patient Care Team: Cletis Athens, MD as PCP - General (Internal Medicine)   Name of the patient: Raymond Humphrey  [id number]  July 30, 1960    Reason for referral- polycythemia   Referring physician- Dr. Lavera Guise.   Date of visit: 04/11/19   History of presenting illness-patient is a 58 year old male who is a current smoker and has history of obstructive sleep apnea and uses CPAP daily.  He has been referred for polycythemia.  His hemoglobin has been mostly between 15-16.  Patient denies any prior episodes of thrombosis heart attacks or strokes.  He reports ongoing fatigue but denies other complaints  ECOG PS- 1  Pain scale- 0   Review of systems- Review of Systems  Constitutional: Negative for chills, fever, malaise/fatigue and weight loss.  HENT: Negative for congestion, ear discharge and nosebleeds.   Eyes: Negative for blurred vision.  Respiratory: Negative for cough, hemoptysis, sputum production, shortness of breath and wheezing.   Cardiovascular: Negative for chest pain, palpitations, orthopnea and claudication.  Gastrointestinal: Negative for abdominal pain, blood in stool, constipation, diarrhea, heartburn, melena, nausea and vomiting.  Genitourinary: Negative for dysuria, flank pain, frequency, hematuria and urgency.  Musculoskeletal: Negative for back pain, joint pain and myalgias.  Skin: Negative for rash.  Neurological: Negative for dizziness, tingling, focal weakness, seizures, weakness and headaches.  Endo/Heme/Allergies: Does not bruise/bleed easily.  Psychiatric/Behavioral: Negative for depression and suicidal ideas. The patient does not have insomnia.     No Known Allergies  Patient Active Problem List   Diagnosis Date Noted  . Morbid obesity ([place]) 06/13/2016  . Coronary artery disease 01/04/2011  . RVF (right ventricular failure) ([place]) 01/04/2011    . Hyperlipidemia 01/04/2011  . OSA (obstructive sleep apnea) 01/04/2011  . Smoking 01/04/2011     Past Medical History:  Diagnosis Date  . Coronary artery disease    CAD: MI in 1/09 in [place]. Had BMS x 2 to the RCA at that time. 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 Nuclear Stress Test (01/2012):  diaph atten, no ischemia, EF 56%.    Marland Kitchen Hx of cardiovascular stress test    GXT (2/16): no ischemic EKG changes  . Hyperlipidemia   . Hypertension   . Mild pulmonic regurgitation and RV dilation by prior echocardiogram    Dilated RV on 2009 echo. However, echo (10/12) showed EF 55%, basal to mid inferior hypokinesis, grade II diastolic dysfunction, normal RV size and systolic function, PA systolic pressure;  Echocardiogram (01/28/11):  EF 55%, inf mild HK, Gr 2 DD, mild LAE, mild RAE  . Obesity   . OSA (obstructive sleep apnea)    on CPAP  . Smoking      Past Surgical History:  Procedure Laterality Date  . APPENDECTOMY    . PERCUTANEOUS CORONARY STENT INTERVENTION (PCI-S)  2009   BMS x 2 to RCA - hospital in [place] History   Socioeconomic History  . Marital status: Married    Spouse name: Not on file  . Number of children: Not on file  . Years of education: Not on file  . Highest education level: Not on file  Occupational History  . Occupation: Truck Education administrator: [employer]  Tobacco Use  . Smoking status: Current Every Day Smoker    Packs/day: 0.50    Years: 15.00  Pack years: 7.50    Types: Cigarettes  . Smokeless tobacco: Never Used  Substance and Sexual Activity  . Alcohol use: Yes    Comment: 6pack a week when home  . Drug use: No  . Sexual activity: Not on file  Other Topics Concern  . Not on file  Social History Narrative  . Not on file   Social Determinants of Health   Financial Resource Strain:   . Difficulty of Paying Living Expenses: Not on file  Food  Insecurity:   . Worried About Charity fundraiser in the Last Year: Not on file  . Ran Out of Food in the Last Year: Not on file  Transportation Needs:   . Lack of Transportation (Medical): Not on file  . Lack of Transportation (Non-Medical): Not on file  Physical Activity:   . Days of Exercise per Week: Not on file  . Minutes of Exercise per Session: Not on file  Stress:   . Feeling of Stress : Not on file  Social Connections:   . Frequency of Communication with Friends and Family: Not on file  . Frequency of Social Gatherings with Friends and Family: Not on file  . Attends Religious Services: Not on file  . Active Member of Clubs or Organizations: Not on file  . Attends Archivist Meetings: Not on file  . Marital Status: Not on file  Intimate Partner Violence:   . Fear of Current or Ex-Partner: Not on file  . Emotionally Abused: Not on file  . Physically Abused: Not on file  . Sexually Abused: Not on file     Family History  Problem Relation Age of Onset  . Heart attack Father   . Heart attack Cousin   . Heart attack Maternal Grandfather      Current Outpatient Medications:  .  aspirin EC 81 MG tablet, Take 1 tablet (81 mg total) by mouth daily., Disp: , Rfl:  .  atorvastatin (LIPITOR) 40 MG tablet, Take 1 tablet (40 mg total) by mouth daily., Disp: 30 tablet, Rfl: 11 .  lisinopril (PRINIVIL,ZESTRIL) 10 MG tablet, Take 1 tablet (10 mg total) by mouth daily. (Patient taking differently: Take 20 mg by mouth daily. ), Disp: 30 tablet, Rfl: 6 .  nitroGLYCERIN (NITROSTAT) 0.4 MG SL tablet, Place 1 tablet (0.4 mg total) under the tongue every 5 (five) minutes as needed for chest pain., Disp: 25 tablet, Rfl: 6 .  sildenafil (VIAGRA) 50 MG tablet, , Disp: , Rfl:    Physical exam:  Vitals:   04/11/19 1352  BP: (!) 145/89  Pulse: 90  Temp: 99 F (37.2 C)  TempSrc: Tympanic  Weight: 292 lb (132.5 kg)  Height: 5\' 8"  (1.727 m)   Physical Exam Constitutional:       General: He is not in acute distress.    Appearance: He is obese.  HENT:     Head: Normocephalic and atraumatic.  Eyes:     Pupils: Pupils are equal, round, and reactive to light.  Cardiovascular:     Rate and Rhythm: Normal rate and regular rhythm.     Heart sounds: Normal heart sounds.  Pulmonary:     Effort: Pulmonary effort is normal.     Breath sounds: Normal breath sounds.  Abdominal:     General: Bowel sounds are normal.     Palpations: Abdomen is soft.     Comments: No palpable splenomegaly  Musculoskeletal:     Cervical back: Normal range of  motion.  Skin:    General: Skin is warm and dry.  Neurological:     Mental Status: He is alert and oriented to person, place, and time.        CMP Latest Ref Rng & Units 02/23/2016  Glucose 65 - 99 mg/dL 107(H)  BUN 6 - 20 mg/dL 20  Creatinine 0.61 - 1.24 mg/dL 1.36(H)  Sodium 135 - 145 mmol/L 136  Potassium 3.5 - 5.1 mmol/L 4.4  Chloride 101 - 111 mmol/L 103  CO2 22 - 32 mmol/L 28  Calcium 8.9 - 10.3 mg/dL 8.6(L)  Total Protein 6.5 - 8.1 g/dL 7.6  Total Bilirubin 0.3 - 1.2 mg/dL 0.3  Alkaline Phos 38 - 126 U/L 81  AST 15 - 41 U/L 31  ALT 17 - 63 U/L 41   CBC Latest Ref Rng & Units 04/11/2019  WBC 4.0 - 10.5 K/uL 7.8  Hemoglobin 13.0 - 17.0 g/dL 17.1(H)  Hematocrit 39.0 - 52.0 % 51.5  Platelets 150 - 400 K/uL 216     Assessment and plan- Patient is a 58 y.o. male referred for polycythemia  I discussed with the patient the differences between primary site polycythemia or polycythemia vera versus secondary polycythemia.  Today I will obtain a CBC with differential JAK2 and EPO levels to rule out primary polycythemia vera.  Given his ongoing history of smoking I suspect his polycythemia is secondary.  Patient also has a history of obstructive sleep apnea but reports that it is generally under good control and he has remained compliant with CPAP.  Discussed other causes of polycythemia such as tumor associated polycythemia  which can be seen in liver kidney cancers which can be associated with high EPO levels.  Patient verbalized understanding.  Results are overall consistent with secondary polycythemia I would have a higher threshold to initiate phlebotomy when his hematocrit is between 50-55.  Patient will go for blood work today and I will have a video visit with him tentatively in 3 weeks time   Thank you for this kind referral and the opportunity to participate in the care of this patient   Visit Diagnosis 1. Polycythemia     Dr. Randa Evens, MD, MPH [facility] at [facility] [id number] 04/11/2019 4:55 PM

## 2019-04-12 ENCOUNTER — Telehealth: Payer: Self-pay | Admitting: *Deleted

## 2019-04-12 LAB — ERYTHROPOIETIN: Erythropoietin: 5.5 m[IU]/mL (ref 2.6–18.5)

## 2019-04-12 NOTE — Telephone Encounter (Signed)
Received referral for low dose lung cancer screening CT scan. Message left at phone number listed in EMR for patient to call me back to facilitate scheduling scan.  

## 2019-04-17 LAB — JAK2 GENOTYPR

## 2019-04-30 ENCOUNTER — Telehealth: Payer: 59 | Admitting: Oncology

## 2019-05-02 ENCOUNTER — Other Ambulatory Visit: Payer: Self-pay

## 2019-05-02 ENCOUNTER — Inpatient Hospital Stay: Payer: 59 | Attending: Oncology | Admitting: Oncology

## 2019-05-02 ENCOUNTER — Encounter: Payer: Self-pay | Admitting: Oncology

## 2019-05-02 DIAGNOSIS — F17210 Nicotine dependence, cigarettes, uncomplicated: Secondary | ICD-10-CM

## 2019-05-02 DIAGNOSIS — D751 Secondary polycythemia: Secondary | ICD-10-CM

## 2019-05-02 NOTE — Progress Notes (Signed)
Patient stated that he had been doing well with no complaints. 

## 2019-05-05 NOTE — Progress Notes (Signed)
I connected with Raymond Humphrey on 05/05/19 at  1:30 PM EST by video enabled telemedicine visit and verified that I am speaking with the correct person using two identifiers.   I discussed the limitations, risks, security and privacy concerns of performing an evaluation and management service by telemedicine and the availability of in-person appointments. I also discussed with the patient that there may be a patient responsible charge related to this service. The patient expressed understanding and agreed to proceed.  Other persons participating in the visit and their role in the encounter:  none  Patient's location:  home Provider's location:  work  Risk analyst Complaint:  Routine f/u of polycythemia  History of present illness: patient is a 59 year old male who is a current smoker and has history of obstructive sleep apnea and uses CPAP daily.  He has been referred for polycythemia.  His hemoglobin has been mostly between 15-16.  Patient denies any prior episodes of thrombosis heart attacks or strokes.    JAK 2 negative. epo level normal. Urinalysis showed no rbcs but positive for hemoglobin  Interval history he is doing well. Denies any complaints at this time other than mild fatigue   Review of Systems  Constitutional: Positive for malaise/fatigue. Negative for chills, fever and weight loss.  HENT: Negative for congestion, ear discharge and nosebleeds.   Eyes: Negative for blurred vision.  Respiratory: Negative for cough, hemoptysis, sputum production, shortness of breath and wheezing.   Cardiovascular: Negative for chest pain, palpitations, orthopnea and claudication.  Gastrointestinal: Negative for abdominal pain, blood in stool, constipation, diarrhea, heartburn, melena, nausea and vomiting.  Genitourinary: Negative for dysuria, flank pain, frequency, hematuria and urgency.  Musculoskeletal: Negative for back pain, joint pain and myalgias.  Skin: Negative for rash.  Neurological: Negative  for dizziness, tingling, focal weakness, seizures, weakness and headaches.  Endo/Heme/Allergies: Does not bruise/bleed easily.  Psychiatric/Behavioral: Negative for depression and suicidal ideas. The patient does not have insomnia.     No Known Allergies  Past Medical History:  Diagnosis Date  . Coronary artery disease    CAD: MI in 1/09 in [place]. Had BMS x 2 to the RCA at that time. 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 Nuclear Stress Test (01/2012):  diaph atten, no ischemia, EF 56%.    Marland Kitchen Hx of cardiovascular stress test    GXT (2/16): no ischemic EKG changes  . Hyperlipidemia   . Hypertension   . Mild pulmonic regurgitation and RV dilation by prior echocardiogram    Dilated RV on 2009 echo. However, echo (10/12) showed EF 55%, basal to mid inferior hypokinesis, grade II diastolic dysfunction, normal RV size and systolic function, PA systolic pressure;  Echocardiogram (01/28/11):  EF 55%, inf mild HK, Gr 2 DD, mild LAE, mild RAE  . Obesity   . OSA (obstructive sleep apnea)    on CPAP  . Smoking     Past Surgical History:  Procedure Laterality Date  . APPENDECTOMY    . PERCUTANEOUS CORONARY STENT INTERVENTION (PCI-S)  2009   BMS x 2 to RCA - hospital in [place] History   Socioeconomic History  . Marital status: Married    Spouse name: Not on file  . Number of children: Not on file  . Years of education: Not on file  . Highest education level: Not on file  Occupational History  . Occupation: Truck Education administrator: [employer]  Tobacco Use  . Smoking status: Current Every Day Smoker    Packs/day: 0.50    Years: 15.00    Pack years: 7.50    Types: Cigarettes  . Smokeless tobacco: Never Used  Substance and Sexual Activity  . Alcohol use: Yes    Comment: 6pack a week when home  . Drug use: No  . Sexual activity: Not on file  Other Topics Concern  . Not on file  Social History  Narrative  . Not on file   Social Determinants of Health   Financial Resource Strain:   . Difficulty of Paying Living Expenses: Not on file  Food Insecurity:   . Worried About Charity fundraiser in the Last Year: Not on file  . Ran Out of Food in the Last Year: Not on file  Transportation Needs:   . Lack of Transportation (Medical): Not on file  . Lack of Transportation (Non-Medical): Not on file  Physical Activity:   . Days of Exercise per Week: Not on file  . Minutes of Exercise per Session: Not on file  Stress:   . Feeling of Stress : Not on file  Social Connections:   . Frequency of Communication with Friends and Family: Not on file  . Frequency of Social Gatherings with Friends and Family: Not on file  . Attends Religious Services: Not on file  . Active Member of Clubs or Organizations: Not on file  . Attends Archivist Meetings: Not on file  . Marital Status: Not on file  Intimate Partner Violence:   . Fear of Current or Ex-Partner: Not on file  . Emotionally Abused: Not on file  . Physically Abused: Not on file  . Sexually Abused: Not on file    Family History  Problem Relation Age of Onset  . Heart attack Father   . Heart attack Cousin   . Heart attack Maternal Grandfather      Current Outpatient Medications:  .  aspirin EC 81 MG tablet, Take 1 tablet (81 mg total) by mouth daily., Disp: , Rfl:  .  atorvastatin (LIPITOR) 40 MG tablet, Take 1 tablet (40 mg total) by mouth daily., Disp: 30 tablet, Rfl: 11 .  lisinopril (ZESTRIL) 20 MG tablet, Take 20 mg by mouth daily., Disp: , Rfl:  .  sildenafil (VIAGRA) 50 MG tablet, , Disp: , Rfl:  .  nitroGLYCERIN (NITROSTAT) 0.4 MG SL tablet, Place 1 tablet (0.4 mg total) under the tongue every 5 (five) minutes as needed for chest pain. (Patient not taking: Reported on 05/02/2019), Disp: 25 tablet, Rfl: 6  No results found.  No images are attached to the encounter.   CMP Latest Ref Rng & Units 02/23/2016   Glucose 65 - 99 mg/dL 107(H)  BUN 6 - 20 mg/dL 20  Creatinine 0.61 - 1.24 mg/dL 1.36(H)  Sodium 135 - 145 mmol/L 136  Potassium 3.5 - 5.1 mmol/L 4.4  Chloride 101 - 111 mmol/L 103  CO2 22 - 32 mmol/L 28  Calcium 8.9 - 10.3 mg/dL 8.6(L)  Total Protein 6.5 - 8.1 g/dL 7.6  Total Bilirubin 0.3 - 1.2 mg/dL 0.3  Alkaline Phos 38 - 126 U/L 81  AST 15 - 41 U/L 31  ALT 17 - 63 U/L 41   CBC Latest Ref Rng & Units 04/11/2019  WBC 4.0 - 10.5 K/uL 7.8  Hemoglobin 13.0 - 17.0 g/dL 17.1(H)  Hematocrit 39.0 - 52.0 % 51.5  Platelets 150 - 400 K/uL 216  Observation/objective:appears in no acute distress over video visit today. Breathing is non labored  Assessment and plan:Patient is a 59 yr old male referred for polycythemia likely secondary to smoking  I have personally reviewed lab results from 04/11/19. JAK 2 was negative. epo levels are normal. He does not have polycythemia vera. Suspect that his polycythemia is secondary to smoking.  I would consider phlebotomy if his hct is higher closer to 55. We can otherwise monitor his H/H conservatively. I have encouraged him to quit smoking  Follow-up instructions: will check cbc in 3 and 6 months and see him back in 6 months. He had hemoglobin on his UA but no RBCs. Will repeat UA in 3 months  I discussed the assessment and treatment plan with the patient. The patient was provided an opportunity to ask questions and all were answered. The patient agreed with the plan and demonstrated an understanding of the instructions.   The patient was advised to call back or seek an in-person evaluation if the symptoms worsen or if the condition fails to improve as anticipated.  Visit Diagnosis: 1. Secondary polycythemia     Dr. Randa Evens, MD, MPH [facility] [phone] 05/05/2019 5:11 PM

## 2019-05-07 ENCOUNTER — Telehealth: Payer: Self-pay | Admitting: *Deleted

## 2019-05-07 NOTE — Telephone Encounter (Signed)
Received referral for low dose lung cancer screening CT scan. Message left at phone number listed in EMR for patient to call me back to facilitate scheduling scan.  

## 2019-05-14 ENCOUNTER — Telehealth: Payer: Self-pay | Admitting: *Deleted

## 2019-05-14 NOTE — Telephone Encounter (Signed)
Received referral for low dose lung cancer screening CT scan. Message left at phone number listed in EMR for patient to call me back to facilitate scheduling scan.  

## 2019-05-29 ENCOUNTER — Encounter: Payer: Self-pay | Admitting: *Deleted

## 2019-05-29 ENCOUNTER — Telehealth: Payer: Self-pay | Admitting: *Deleted

## 2019-05-29 DIAGNOSIS — Z87891 Personal history of nicotine dependence: Secondary | ICD-10-CM

## 2019-05-29 NOTE — Telephone Encounter (Signed)
Received referral for initial lung cancer screening scan. Contacted patient and obtained smoking history,(current, 70 pack year) as well as answering questions related to screening process. Patient denies signs of lung cancer such as weight loss or hemoptysis. Patient denies comorbidity that would prevent curative treatment if lung cancer were found. Patient is scheduled for shared decision making visit and CT scan on 06/05/19 at 1015am.

## 2019-05-29 NOTE — Telephone Encounter (Signed)
Left message for patient to notify them that it is time to schedule annual low dose lung cancer screening CT scan. Instructed patient to call back to verify information prior to the scan being scheduled.  

## 2019-06-05 ENCOUNTER — Ambulatory Visit
Admission: RE | Admit: 2019-06-05 | Discharge: 2019-06-05 | Disposition: A | Discharge status: Home / Self Care | Payer: 59 | Source: Ambulatory Visit | Attending: Oncology | Admitting: Oncology

## 2019-06-05 ENCOUNTER — Other Ambulatory Visit: Payer: Self-pay

## 2019-06-05 ENCOUNTER — Encounter: Payer: Self-pay | Admitting: Oncology

## 2019-06-05 ENCOUNTER — Inpatient Hospital Stay: Payer: 59 | Attending: Oncology | Admitting: Oncology

## 2019-06-05 DIAGNOSIS — Z87891 Personal history of nicotine dependence: Secondary | ICD-10-CM

## 2019-06-05 DIAGNOSIS — Z122 Encounter for screening for malignant neoplasm of respiratory organs: Secondary | ICD-10-CM | POA: Diagnosis not present

## 2019-06-05 DIAGNOSIS — F17210 Nicotine dependence, cigarettes, uncomplicated: Secondary | ICD-10-CM | POA: Diagnosis not present

## 2019-06-05 NOTE — Progress Notes (Signed)
Virtual Visit via Video Note  I connected with Raymond Humphrey on 06/05/19 at 10:15 AM EST by a video enabled telemedicine application and verified that I am speaking with the correct person using two identifiers.  Location: Patient: [facility] Provider: Office   I discussed the limitations of evaluation and management by telemedicine and the availability of in person appointments. The patient expressed understanding and agreed to proceed.  I discussed the assessment and treatment plan with the patient. The patient was provided an opportunity to ask questions and all were answered. The patient agreed with the plan and demonstrated an understanding of the instructions.   The patient was advised to call back or seek an in-person evaluation if the symptoms worsen or if the condition fails to improve as anticipated.   In accordance with CMS guidelines, patient has met eligibility criteria including age, absence of signs or symptoms of lung cancer.  Social History   Tobacco Use  . Smoking status: Current Every Day Smoker    Packs/day: 2.00    Years: 35.00    Pack years: 70.00    Types: Cigarettes  . Smokeless tobacco: Never Used  Substance Use Topics  . Alcohol use: Yes    Comment: 6pack a week when home  . Drug use: No      A shared decision-making session was conducted prior to the performance of CT scan. This includes one or more decision aids, includes benefits and harms of screening, follow-up diagnostic testing, over-diagnosis, false positive rate, and total radiation exposure.   Counseling on the importance of adherence to annual lung cancer LDCT screening, impact of co-morbidities, and ability or willingness to undergo diagnosis and treatment is imperative for compliance of the program.   Counseling on the importance of continued smoking cessation for former smokers; the importance of smoking cessation for current smokers, and information about tobacco cessation interventions have been  given to patient including [place] and [place] quit [place] programs.   Written order for lung cancer screening with LDCT has been given to the patient and any and all questions have been answered to the best of my abilities.    Yearly follow up will be coordinated by Burgess Estelle, Thoracic Navigator.  I provided 15 minutes of face-to-face video visit time during this encounter, and > 50% was spent counseling as documented under my assessment & plan.   Jacquelin Hawking, NP

## 2019-06-06 ENCOUNTER — Telehealth: Payer: Self-pay | Admitting: *Deleted

## 2019-06-06 NOTE — Telephone Encounter (Signed)
Notified patient of LDCT lung cancer screening program results with recommendation for 12 month follow up imaging. Also notified of incidental findings noted below and is encouraged to discuss further with PCP who will receive a copy of this note and/or the CT report. Patient verbalizes understanding.   IMPRESSION: Lung-RADS 1, negative. Continue annual screening with low-dose chest CT without contrast in 12 months.  Aortic Atherosclerosis (ICD10-I70.0) and Emphysema (ICD10

## 2019-07-31 ENCOUNTER — Inpatient Hospital Stay: Payer: 59 | Attending: Oncology

## 2019-07-31 ENCOUNTER — Other Ambulatory Visit: Payer: Self-pay

## 2019-07-31 ENCOUNTER — Inpatient Hospital Stay: Payer: 59

## 2019-07-31 DIAGNOSIS — D751 Secondary polycythemia: Secondary | ICD-10-CM | POA: Diagnosis present

## 2019-07-31 LAB — URINALYSIS, COMPLETE (UACMP) WITH MICROSCOPIC
Bacteria, UA: NONE SEEN
Bilirubin Urine: NEGATIVE
Glucose, UA: NEGATIVE mg/dL
Hgb urine dipstick: NEGATIVE
Ketones, ur: NEGATIVE mg/dL
Leukocytes,Ua: NEGATIVE
Nitrite: NEGATIVE
Protein, ur: NEGATIVE mg/dL
Specific Gravity, Urine: 1.016 (ref 1.005–1.030)
Squamous Epithelial / HPF: NONE SEEN (ref 0–5)
pH: 6 (ref 5.0–8.0)

## 2019-07-31 LAB — CBC
HCT: 49.8 % (ref 39.0–52.0)
Hemoglobin: 17 g/dL (ref 13.0–17.0)
MCH: 29.8 pg (ref 26.0–34.0)
MCHC: 34.1 g/dL (ref 30.0–36.0)
MCV: 87.4 fL (ref 80.0–100.0)
Platelets: 215 10*3/uL (ref 150–400)
RBC: 5.7 MIL/uL (ref 4.22–5.81)
RDW: 12.3 % (ref 11.5–15.5)
WBC: 8.6 10*3/uL (ref 4.0–10.5)
nRBC: 0 % (ref 0.0–0.2)

## 2019-07-31 NOTE — Progress Notes (Signed)
No phlebotomy needed today per Dr Janese Banks, pt notified.

## 2019-10-31 ENCOUNTER — Inpatient Hospital Stay: Payer: 59 | Admitting: Oncology

## 2019-10-31 ENCOUNTER — Inpatient Hospital Stay: Payer: 59

## 2019-11-07 ENCOUNTER — Inpatient Hospital Stay: Payer: 59 | Attending: Oncology

## 2019-11-07 ENCOUNTER — Encounter: Payer: Self-pay | Admitting: Oncology

## 2019-11-07 ENCOUNTER — Inpatient Hospital Stay: Payer: 59

## 2019-11-07 ENCOUNTER — Other Ambulatory Visit: Payer: Self-pay

## 2019-11-07 ENCOUNTER — Inpatient Hospital Stay: Payer: 59 | Admitting: Oncology

## 2019-11-07 VITALS — BP 136/71 | HR 2 | Temp 98.7°F | Resp 16 | Ht 68.0 in | Wt 289.0 lb

## 2019-11-07 DIAGNOSIS — F17210 Nicotine dependence, cigarettes, uncomplicated: Secondary | ICD-10-CM | POA: Diagnosis not present

## 2019-11-07 DIAGNOSIS — D751 Secondary polycythemia: Secondary | ICD-10-CM

## 2019-11-07 LAB — CBC
HCT: 47.8 % (ref 39.0–52.0)
Hemoglobin: 16.4 g/dL (ref 13.0–17.0)
MCH: 29.4 pg (ref 26.0–34.0)
MCHC: 34.3 g/dL (ref 30.0–36.0)
MCV: 85.7 fL (ref 80.0–100.0)
Platelets: 191 10*3/uL (ref 150–400)
RBC: 5.58 MIL/uL (ref 4.22–5.81)
RDW: 12.4 % (ref 11.5–15.5)
WBC: 8.6 10*3/uL (ref 4.0–10.5)
nRBC: 0 % (ref 0.0–0.2)

## 2019-11-07 NOTE — Progress Notes (Signed)
Hematology/Oncology Consult note [facility]  [phone] [phone]  Patient Care Team: Cletis Athens, MD as PCP - General (Internal Medicine) Sindy Guadeloupe, MD as Consulting Physician (Oncology)   Name of the patient: Raymond Humphrey  [id number]  [birth year]   Date of visit: 11/07/19  Diagnosis-secondary polycythemia likely due to smoking  Chief complaint/ Reason for visit-routine follow-up of polycythemia  Heme/Onc history: patient is a 59 year old male who is a current smoker and has history of obstructive sleep apnea and uses CPAP daily. He has been referred for polycythemia. His hemoglobin has been mostly between 15-16. Patient denies any prior episodes of thrombosis heart attacks or strokes.   JAK 2 negative. epo level normal. Urinalysis showed no rbcs but positive for hemoglobin.  Polycythemia is likely due to smoking  Interval history-patient states that he is cutting down on his smoking but has not completely quit yet.  Overall feels well and denies any complaints at this time other than mild fatigue  ECOG PS- 1 Pain scale- 0   Review of systems- Review of Systems  Constitutional: Positive for malaise/fatigue. Negative for chills, fever and weight loss.  HENT: Negative for congestion, ear discharge and nosebleeds.   Eyes: Negative for blurred vision.  Respiratory: Negative for cough, hemoptysis, sputum production, shortness of breath and wheezing.   Cardiovascular: Negative for chest pain, palpitations, orthopnea and claudication.  Gastrointestinal: Negative for abdominal pain, blood in stool, constipation, diarrhea, heartburn, melena, nausea and vomiting.  Genitourinary: Negative for dysuria, flank pain, frequency, hematuria and urgency.  Musculoskeletal: Negative for back pain, joint pain and myalgias.  Skin: Negative for rash.  Neurological: Negative for dizziness, tingling, focal weakness, seizures, weakness and  headaches.  Endo/Heme/Allergies: Does not bruise/bleed easily.  Psychiatric/Behavioral: Negative for depression and suicidal ideas. The patient does not have insomnia.       No Known Allergies   Past Medical History:  Diagnosis Date  . Coronary artery disease    CAD: MI in 1/09 in [place]. Had BMS x 2 to the RCA at that time. 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 Nuclear Stress Test (01/2012):  diaph atten, no ischemia, EF 56%.    Marland Kitchen Hx of cardiovascular stress test    GXT (2/16): no ischemic EKG changes  . Hyperlipidemia   . Hypertension   . Mild pulmonic regurgitation and RV dilation by prior echocardiogram    Dilated RV on 2009 echo. However, echo (10/12) showed EF 55%, basal to mid inferior hypokinesis, grade II diastolic dysfunction, normal RV size and systolic function, PA systolic pressure;  Echocardiogram (01/28/11):  EF 55%, inf mild HK, Gr 2 DD, mild LAE, mild RAE  . Obesity   . OSA (obstructive sleep apnea)    on CPAP  . Smoking      Past Surgical History:  Procedure Laterality Date  . APPENDECTOMY    . PERCUTANEOUS CORONARY STENT INTERVENTION (PCI-S)  2009   BMS x 2 to RCA - hospital in [place] History   Socioeconomic History  . Marital status: Married    Spouse name: Not on file  . Number of children: Not on file  . Years of education: Not on file  . Highest education level: Not on file  Occupational History  . Occupation: Truck Education administrator: [employer]  Tobacco Use  . Smoking status: Current Every Day Smoker  Packs/day: 2.00    Years: 35.00    Pack years: 70.00    Types: Cigarettes  . Smokeless tobacco: Never Used  Substance and Sexual Activity  . Alcohol use: Yes    Comment: 6pack a week when home  . Drug use: No  . Sexual activity: Not on file  Other Topics Concern  . Not on file  Social History Narrative  . Not on file   Social Determinants of Health    Financial Resource Strain:   . Difficulty of Paying Living Expenses:   Food Insecurity:   . Worried About Charity fundraiser in the Last Year:   . Arboriculturist in the Last Year:   Transportation Needs:   . Film/video editor (Medical):   Marland Kitchen Lack of Transportation (Non-Medical):   Physical Activity:   . Days of Exercise per Week:   . Minutes of Exercise per Session:   Stress:   . Feeling of Stress :   Social Connections:   . Frequency of Communication with Friends and Family:   . Frequency of Social Gatherings with Friends and Family:   . Attends Religious Services:   . Active Member of Clubs or Organizations:   . Attends Archivist Meetings:   Marland Kitchen Marital Status:   Intimate Partner Violence:   . Fear of Current or Ex-Partner:   . Emotionally Abused:   Marland Kitchen Physically Abused:   . Sexually Abused:     Family History  Problem Relation Age of Onset  . Heart attack Father   . Heart attack Cousin   . Heart attack Maternal Grandfather      Current Outpatient Medications:  .  aspirin EC 81 MG tablet, Take 1 tablet (81 mg total) by mouth daily., Disp: , Rfl:  .  atorvastatin (LIPITOR) 40 MG tablet, Take 1 tablet (40 mg total) by mouth daily., Disp: 30 tablet, Rfl: 11 .  lisinopril (ZESTRIL) 20 MG tablet, Take 20 mg by mouth daily., Disp: , Rfl:  .  nitroGLYCERIN (NITROSTAT) 0.4 MG SL tablet, Place 1 tablet (0.4 mg total) under the tongue every 5 (five) minutes as needed for chest pain. (Patient not taking: Reported on 05/02/2019), Disp: 25 tablet, Rfl: 6 .  sildenafil (VIAGRA) 50 MG tablet, , Disp: , Rfl:   Physical exam:  Vitals:   11/07/19 1339  BP: 136/71  Pulse: 82  Resp: 16  Temp: 98.7 F (37.1 C)  TempSrc: Oral  Weight: 289 lb (131.1 kg)  Height: 5\' 8"  (1.727 m)   Physical Exam Constitutional:      General: He is not in acute distress. Cardiovascular:     Rate and Rhythm: Normal rate and regular rhythm.     Heart sounds: Normal heart sounds.    Pulmonary:     Effort: Pulmonary effort is normal.     Breath sounds: Normal breath sounds.  Abdominal:     General: Bowel sounds are normal.     Palpations: Abdomen is soft.  Skin:    General: Skin is warm and dry.  Neurological:     Mental Status: He is alert and oriented to person, place, and time.      CMP Latest Ref Rng & Units 02/23/2016  Glucose 65 - 99 mg/dL 107(H)  BUN 6 - 20 mg/dL 20  Creatinine 0.61 - 1.24 mg/dL 1.36(H)  Sodium 135 - 145 mmol/L 136  Potassium 3.5 - 5.1 mmol/L 4.4  Chloride 101 - 111 mmol/L 103  CO2  22 - 32 mmol/L 28  Calcium 8.9 - 10.3 mg/dL 8.6(L)  Total Protein 6.5 - 8.1 g/dL 7.6  Total Bilirubin 0.3 - 1.2 mg/dL 0.3  Alkaline Phos 38 - 126 U/L 81  AST 15 - 41 U/L 31  ALT 17 - 63 U/L 41   CBC Latest Ref Rng & Units 11/07/2019  WBC 4.0 - 10.5 K/uL 8.6  Hemoglobin 13.0 - 17.0 g/dL 16.4  Hematocrit 39 - 52 % 47.8  Platelets 150 - 400 K/uL 191      Assessment and plan- Patient is a 59 y.o. male with secondary polycythemia likely due to smoking here for routine follow-up  Patient's H&H is 16.4/47.8 today.  It has come down over the last 3 to 4 months after he has cut down on smoking.  He does not require phlebotomy at this time since his hematocrit is less than 55.  Repeat CBC in 6 months in 1 year and I will see him back in 1 year.Also discussed his recent CT scan which was done for lung cancer screening program.  He was concerned about the mild centrilobular and paraseptal emphysematous changes it was noted in the upper lung.  From a respiratory standpoint patient is asymptomatic and does not report any exertional shortness of breath.  I have asked him to keep an eye out on his respiratory symptoms and if it gets worse he should get in touch with Dr. Rebecka Apley to see if any inhaler treatment would be warranted   Visit Diagnosis 1. Secondary polycythemia      Dr. Randa Evens, MD, MPH [facility] [phone] 11/07/2019 1:35 PM

## 2019-11-07 NOTE — Progress Notes (Signed)
Pt here to see if he needs phlebotomy, pt eating and drinking good, bowels good. Pt wants to know what he should do about results from cxr

## 2020-02-19 ENCOUNTER — Ambulatory Visit: Payer: 59 | Admitting: Internal Medicine

## 2020-02-20 ENCOUNTER — Other Ambulatory Visit: Payer: Self-pay | Admitting: *Deleted

## 2020-02-20 MED ORDER — SILDENAFIL CITRATE 50 MG PO TABS: 50.0000 mg | ORAL_TABLET | ORAL | 6 refills | Status: DC | PRN

## 2020-02-26 ENCOUNTER — Encounter: Payer: Self-pay | Admitting: Internal Medicine

## 2020-02-26 ENCOUNTER — Ambulatory Visit: Payer: 59 | Admitting: Internal Medicine

## 2020-02-26 ENCOUNTER — Other Ambulatory Visit: Payer: Self-pay

## 2020-02-26 DIAGNOSIS — I2510 Atherosclerotic heart disease of native coronary artery without angina pectoris: Secondary | ICD-10-CM

## 2020-02-26 DIAGNOSIS — G4733 Obstructive sleep apnea (adult) (pediatric): Secondary | ICD-10-CM

## 2020-02-26 DIAGNOSIS — Z0000 Encounter for general adult medical examination without abnormal findings: Secondary | ICD-10-CM

## 2020-02-26 DIAGNOSIS — E785 Hyperlipidemia, unspecified: Secondary | ICD-10-CM

## 2020-02-26 DIAGNOSIS — F17200 Nicotine dependence, unspecified, uncomplicated: Secondary | ICD-10-CM

## 2020-02-26 DIAGNOSIS — D751 Secondary polycythemia: Secondary | ICD-10-CM | POA: Insufficient documentation

## 2020-02-26 DIAGNOSIS — Z024 Encounter for examination for driving license: Secondary | ICD-10-CM | POA: Insufficient documentation

## 2020-02-26 NOTE — Assessment & Plan Note (Signed)

## 2020-02-26 NOTE — Assessment & Plan Note (Signed)
-   I instructed the patient to stop smoking and provided them with smoking cessation materials.  - I informed the patient that smoking puts them at increased risk for cancer, COPD, hypertension, and more.  - Informed the patient to seek help if they begin to have trouble breathing, develop chest pain, start to cough up blood, feel faint, or pass out.  

## 2020-02-26 NOTE — Assessment & Plan Note (Signed)
Stable at present.   

## 2020-02-26 NOTE — Assessment & Plan Note (Signed)
-   I encouraged the patient to lose weight.  - I educated them on making healthy dietary choices including eating more fruits and vegetables and less fried foods. - I encouraged the patient to exercise more, and educated on the benefits of exercise including weight loss, diabetes prevention, and hypertension prevention.   

## 2020-02-26 NOTE — Assessment & Plan Note (Signed)
Patient has a history of coronary artery disease.  He has a stent placed in the right coronary artery at the present time he does not have any angina.  There is no evidence of congestive heart failure.

## 2020-02-26 NOTE — Progress Notes (Signed)
Established Patient Office Visit  Subjective:  Patient ID: Raymond Humphrey, male    DB: 02/15/1961  Age: 59 y.o. MRN: [id number]  CC:  Chief Complaint  Patient presents with  . 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     HPI  Raymond Humphrey presents forphysical check  Past Medical History:  Diagnosis Date  . Coronary artery disease    CAD: MI in 1/09 in [place]. Had BMS x 2 to the RCA at that time. 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 Nuclear Stress Test (01/2012):  diaph atten, no ischemia, EF 56%.    Marland Kitchen Hx of cardiovascular stress test    GXT (2/16): no ischemic EKG changes  . Hyperlipidemia   . Hypertension   . Mild pulmonic regurgitation and RV dilation by prior echocardiogram    Dilated RV on 2009 echo. However, echo (10/12) showed EF 55%, basal to mid inferior hypokinesis, grade II diastolic dysfunction, normal RV size and systolic function, PA systolic pressure;  Echocardiogram (01/28/11):  EF 55%, inf mild HK, Gr 2 DD, mild LAE, mild RAE  . Obesity   . OSA (obstructive sleep apnea)    on CPAP  . Polycythemia   . Smoking     Past Surgical History:  Procedure Laterality Date  . APPENDECTOMY    . PERCUTANEOUS CORONARY STENT INTERVENTION (PCI-S)  2009   BMS x 2 to RCA - hospital in [place]     Family History  Problem Relation Age of Onset  . Heart attack Father   . Heart attack Cousin   . Heart attack Maternal Grandfather     Social History   Socioeconomic History  . Marital status: Married    Spouse name: Not on file  . Number of children: Not on file  . Years of education: Not on file  . Highest education level: Not on file  Occupational History  . Occupation: Truck Education administrator: [employer]  Tobacco Use  . Smoking status: Current Every Day Smoker    Packs/day: 0.75     Years: 35.00    Pack years: 26.25    Types: Cigarettes  . Smokeless tobacco: Never Used  Vaping Use  . Vaping Use: Never used  Substance and Sexual Activity  . Alcohol use: Yes    Comment: 6pack a week when home  . Drug use: No  . Sexual activity: Yes  Other Topics Concern  . Not on file  Social History Narrative  . Not on file   Social Determinants of Health   Financial Resource Strain:   . Difficulty of Paying Living Expenses: Not on file  Food Insecurity:   . Worried About Charity fundraiser in the Last Year: Not on file  . Ran Out of Food in the Last Year: Not on file  Transportation Needs:   . Lack of Transportation (Medical): Not on file  . Lack of Transportation (Non-Medical): Not on file  Physical Activity:   . Days of Exercise per Week: Not on file  . Minutes of Exercise per Session: Not on file  Stress:   . Feeling of Stress : Not on file  Social Connections:   . Frequency of Communication with Friends and Family: Not on file  . Frequency of Social Gatherings with Friends and  Family: Not on file  . Attends Religious Services: Not on file  . Active Member of Clubs or Organizations: Not on file  . Attends Archivist Meetings: Not on file  . Marital Status: Not on file  Intimate Partner Violence:   . Fear of Current or Ex-Partner: Not on file  . Emotionally Abused: Not on file  . Physically Abused: Not on file  . Sexually Abused: Not on file     Current Outpatient Medications:  .  aspirin EC 81 MG tablet, Take 1 tablet (81 mg total) by mouth daily., Disp: , Rfl:  .  atorvastatin (LIPITOR) 40 MG tablet, Take 1 tablet (40 mg total) by mouth daily., Disp: 30 tablet, Rfl: 11 .  lisinopril (ZESTRIL) 20 MG tablet, Take 20 mg by mouth daily., Disp: , Rfl:  .  sildenafil (VIAGRA) 50 MG tablet, Take 1 tablet (50 mg total) by mouth as needed for erectile dysfunction., Disp: 10 tablet, Rfl: 6 .  nitroGLYCERIN (NITROSTAT) 0.4 MG SL tablet, Place 1 tablet (0.4  mg total) under the tongue every 5 (five) minutes as needed for chest pain. (Patient not taking: Reported on 05/02/2019), Disp: 25 tablet, Rfl: 6   No Known Allergies  ROS Review of Systems  Constitutional: Negative.  Negative for chills.  HENT: Negative.  Negative for ear discharge and facial swelling.   Eyes: Negative.   Respiratory: Negative.        Patient has a sleep apnea and he uses CPAP machine  Cardiovascular: Negative.   Gastrointestinal: Negative.   Endocrine: Positive for polydipsia. Negative for heat intolerance.  Genitourinary: Negative.  Negative for flank pain and hematuria.  Musculoskeletal: Negative.  Negative for arthralgias, gait problem and joint swelling.  Skin: Negative.   Allergic/Immunologic: Negative.   Neurological: Negative.   Hematological: Negative.   Psychiatric/Behavioral: Negative.  Negative for agitation, behavioral problems, confusion, dysphoric mood, hallucinations and self-injury. The patient is not nervous/anxious.   All other systems reviewed and are negative.     Objective:    Physical Exam Vitals reviewed.  Constitutional:      Appearance: Normal appearance. He is obese.  HENT:     Mouth/Throat:     Mouth: Mucous membranes are moist.  Eyes:     Pupils: Pupils are equal, round, and reactive to light.  Neck:     Vascular: No carotid bruit.  Cardiovascular:     Rate and Rhythm: Normal rate and regular rhythm.     Pulses: Normal pulses.     Heart sounds: Normal heart sounds.  Pulmonary:     Effort: Pulmonary effort is normal.     Breath sounds: Normal breath sounds.  Abdominal:     General: Bowel sounds are normal.     Palpations: Abdomen is soft. There is no hepatomegaly, splenomegaly or mass.     Tenderness: There is no abdominal tenderness.     Hernia: No hernia is present.  Musculoskeletal:     Cervical back: Neck supple.     Right lower leg: No edema.     Left lower leg: No edema.  Skin:    General: Skin is dry.      Findings: No rash.  Neurological:     Mental Status: He is alert and oriented to person, place, and time.     Motor: No weakness.  Psychiatric:        Mood and Affect: Mood normal.        Behavior: Behavior normal.  BP 132/86   Pulse 78   Ht 5\' 7"  (1.702 m)   Wt (!) 307 lb (139.3 kg)   BMI 48.08 kg/m  Wt Readings from Last 3 Encounters:  02/26/20 (!) 307 lb (139.3 kg)  11/07/19 289 lb (131.1 kg)  06/05/19 285 lb (129.3 kg)     Health Maintenance Due  Topic Date Due  . Hepatitis C Screening  Never done  . COVID-19 Vaccine (1) Never done  . HIV Screening  Never done  . TETANUS/TDAP  Never done  . COLONOSCOPY  Never done  . INFLUENZA VACCINE  Never done    There are no preventive care reminders to display for this patient.  No results found for: TSH Lab Results  Component Value Date   WBC 8.6 11/07/2019   HGB 16.4 11/07/2019   HCT 47.8 11/07/2019   MCV 85.7 11/07/2019   PLT 191 11/07/2019   Lab Results  Component Value Date   NA 136 02/23/2016   K 4.4 02/23/2016   CO2 28 02/23/2016   GLUCOSE 107 (H) 02/23/2016   BUN 20 02/23/2016   CREATININE 1.36 (H) 02/23/2016   BILITOT 0.3 02/23/2016   ALKPHOS 81 02/23/2016   AST 31 02/23/2016   ALT 41 02/23/2016   PROT 7.6 02/23/2016   ALBUMIN 4.3 02/23/2016   CALCIUM 8.6 (L) 02/23/2016   ANIONGAP 5 02/23/2016   GFR 80.30 06/23/2014   Lab Results  Component Value Date   CHOL 183 06/23/2014   Lab Results  Component Value Date   HDL 44.70 06/23/2014   Lab Results  Component Value Date   LDLCALC 113 (H) 06/23/2014   Lab Results  Component Value Date   TRIG 125.0 06/23/2014   Lab Results  Component Value Date   CHOLHDL 4 06/23/2014   No results found for: HGBA1C    Assessment & Plan:   Problem List Items Addressed This Visit      Cardiovascular and Mediastinum   Coronary artery disease, non-occlusive    Patient has a history of coronary artery disease.  He has a stent placed in the right  coronary artery at the present time he does not have any angina.  There is no evidence of congestive heart failure.        Respiratory   OSA (obstructive sleep apnea)    Patient has obstructive sleep apnea and he is using the CPAP machine.  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        Other   Hyperlipidemia    - The patient's hyperlipidemia is stable on statin. - The patient will continue the current treatment regimen.  - I encouraged the patient to eat more vegetables and whole wheat, and to avoid fatty foods like whole milk, hard cheese, egg yolks, margarine, baked sweets, and fried foods.  - I encouraged the patient to live an active lifestyle and complete activities for 40 minutes at least three times per week.  - I instructed the patient to go to the ER if they begin having chest pain.        Smoking    - I instructed the patient to stop smoking and provided them with smoking cessation materials.  - I informed the patient that smoking puts them at increased risk for cancer, COPD, hypertension, and more.  - Informed the patient to seek help if they begin to have trouble breathing, develop chest pain, start to cough up blood, feel faint, or pass  out.        Morbid obesity ([place]) - Primary    - I encouraged the patient to lose weight.  - I educated them on making healthy dietary choices including eating more fruits and vegetables and less fried foods. - I encouraged the patient to exercise more, and educated on the benefits of exercise including weight loss, diabetes prevention, and hypertension prevention.        Polycythemia    Stable at present         No orders of the defined types were placed in this encounter.   Follow-up Patient physical examination is normal.  He is known to have a sleep apnea and he is using the CPAP machine.  He also takes blood pressure medication and his blood pressure is stable at the present time.  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   Cletis Athens, MD

## 2020-02-26 NOTE — Assessment & Plan Note (Signed)
Patient has obstructive sleep apnea and he is using the CPAP machine.  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

## 2020-05-08 ENCOUNTER — Other Ambulatory Visit: Payer: 59

## 2020-05-14 ENCOUNTER — Inpatient Hospital Stay: Payer: 59 | Attending: Oncology

## 2020-05-14 ENCOUNTER — Inpatient Hospital Stay: Payer: 59

## 2020-05-18 ENCOUNTER — Other Ambulatory Visit: Payer: Self-pay | Admitting: *Deleted

## 2020-05-18 DIAGNOSIS — Z87891 Personal history of nicotine dependence: Secondary | ICD-10-CM

## 2020-05-18 DIAGNOSIS — Z122 Encounter for screening for malignant neoplasm of respiratory organs: Secondary | ICD-10-CM

## 2020-05-18 NOTE — Progress Notes (Signed)
Contacted and scheduled for annual lung screening scan. Patient is a current smoker with a 70.5 pack year history.  

## 2020-06-04 ENCOUNTER — Ambulatory Visit
Admission: RE | Admit: 2020-06-04 | Discharge: 2020-06-04 | Disposition: A | Discharge status: Home / Self Care | Payer: 59 | Source: Ambulatory Visit | Attending: Nurse Practitioner | Admitting: Nurse Practitioner

## 2020-06-04 ENCOUNTER — Other Ambulatory Visit: Payer: Self-pay

## 2020-06-04 DIAGNOSIS — Z122 Encounter for screening for malignant neoplasm of respiratory organs: Secondary | ICD-10-CM | POA: Diagnosis present

## 2020-06-04 DIAGNOSIS — Z87891 Personal history of nicotine dependence: Secondary | ICD-10-CM

## 2020-06-08 ENCOUNTER — Encounter: Payer: Self-pay | Admitting: *Deleted

## 2020-08-04 ENCOUNTER — Other Ambulatory Visit: Payer: Self-pay | Admitting: Internal Medicine

## 2020-08-11 ENCOUNTER — Ambulatory Visit: Payer: 59 | Admitting: Internal Medicine

## 2020-08-11 ENCOUNTER — Other Ambulatory Visit: Payer: Self-pay

## 2020-08-11 ENCOUNTER — Encounter: Payer: Self-pay | Admitting: Internal Medicine

## 2020-08-11 VITALS — BP 142/83 | HR 85 | Ht 67.0 in | Wt 296.9 lb

## 2020-08-11 DIAGNOSIS — E785 Hyperlipidemia, unspecified: Secondary | ICD-10-CM

## 2020-08-11 DIAGNOSIS — G4733 Obstructive sleep apnea (adult) (pediatric): Secondary | ICD-10-CM

## 2020-08-11 DIAGNOSIS — F17200 Nicotine dependence, unspecified, uncomplicated: Secondary | ICD-10-CM

## 2020-08-11 DIAGNOSIS — I50810 Right heart failure, unspecified: Secondary | ICD-10-CM | POA: Diagnosis not present

## 2020-08-11 DIAGNOSIS — I2510 Atherosclerotic heart disease of native coronary artery without angina pectoris: Secondary | ICD-10-CM

## 2020-08-11 NOTE — Assessment & Plan Note (Signed)
Patient denies any chest pain or shortness of breath he is using his CPAP machine.  He was advised to lose weight.

## 2020-08-11 NOTE — Assessment & Plan Note (Signed)
We will get an echocardiogram.

## 2020-08-11 NOTE — Progress Notes (Signed)
Established Patient Office Visit  Subjective:  Patient ID: Raymond Humphrey, male    DOB: 11/08/1960  Age: 60 y.o. MRN: [id number]  CC:  Chief Complaint  Patient presents with  . Follow-up    HPI  Raymond Humphrey presents for general checkup.  He is known to have exogenous obesity coronary artery disease is    he is trying to follow his diet.  His blood pressure is under control. Past Medical History:  Diagnosis Date  . Coronary artery disease    CAD: MI in 1/09 in [place]. Had BMS x 2 to the RCA at that time. 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 Nuclear Stress Test (01/2012):  diaph atten, no ischemia, EF 56%.    Marland Kitchen Hx of cardiovascular stress test    GXT (2/16): no ischemic EKG changes  . Hyperlipidemia   . Hypertension   . Mild pulmonic regurgitation and RV dilation by prior echocardiogram    Dilated RV on 2009 echo. However, echo (10/12) showed EF 55%, basal to mid inferior hypokinesis, grade II diastolic dysfunction, normal RV size and systolic function, PA systolic pressure;  Echocardiogram (01/28/11):  EF 55%, inf mild HK, Gr 2 DD, mild LAE, mild RAE  . Obesity   . OSA (obstructive sleep apnea)    on CPAP  . Polycythemia   . Smoking     Past Surgical History:  Procedure Laterality Date  . APPENDECTOMY    . PERCUTANEOUS CORONARY STENT INTERVENTION (PCI-S)  2009   BMS x 2 to RCA - hospital in [place]     Family History  Problem Relation Age of Onset  . Heart attack Father   . Heart attack Cousin   . Heart attack Maternal Grandfather     Social History   Socioeconomic History  . Marital status: Married    Spouse name: Not on file  . Number of children: Not on file  . Years of education: Not on file  . Highest education level: Not on file  Occupational History  . Occupation: Truck Education administrator: [employer]  Tobacco Use  . Smoking status: Current Every Day Smoker     Packs/day: 0.75    Years: 35.00    Pack years: 26.25    Types: Cigarettes  . Smokeless tobacco: Never Used  Vaping Use  . Vaping Use: Never used  Substance and Sexual Activity  . Alcohol use: Yes    Comment: 6pack a week when home  . Drug use: No  . Sexual activity: Yes  Other Topics Concern  . Not on file  Social History Narrative  . Not on file   Social Determinants of Health   Financial Resource Strain: Not on file  Food Insecurity: Not on file  Transportation Needs: Not on file  Physical Activity: Not on file  Stress: Not on file  Social Connections: Not on file  Intimate Partner Violence: Not on file     Current Outpatient Medications:  .  aspirin EC 81 MG tablet, Take 1 tablet (81 mg total) by mouth daily., Disp: , Rfl:  .  atorvastatin (LIPITOR) 40 MG tablet, Take 1 tablet (40 mg total) by mouth daily., Disp: 30 tablet, Rfl: 11 .  lisinopril (ZESTRIL) 20 MG tablet, Take 20 mg by mouth daily., Disp: , Rfl:  .  sildenafil (VIAGRA) 50 MG tablet, TAKE 1 TABLET BY MOUTH AS NEEDED FOR ERECTILE DYSFUNCTION, Disp:  30 tablet, Rfl: 6 .  nitroGLYCERIN (NITROSTAT) 0.4 MG SL tablet, Place 1 tablet (0.4 mg total) under the tongue every 5 (five) minutes as needed for chest pain. (Patient not taking: Reported on 05/02/2019), Disp: 25 tablet, Rfl: 6   No Known Allergies  ROS Review of Systems  Constitutional: Negative.   HENT: Negative.   Eyes: Negative.   Respiratory: Negative.   Cardiovascular: Negative.   Gastrointestinal: Negative.   Endocrine: Negative.   Genitourinary: Negative.   Musculoskeletal: Negative.   Skin: Negative.   Allergic/Immunologic: Negative.   Neurological: Negative.   Hematological: Negative.   Psychiatric/Behavioral: Negative.   All other systems reviewed and are negative.     Objective:    Physical Exam Vitals reviewed.  Constitutional:      Appearance: Normal appearance. He is obese.  HENT:     Mouth/Throat:     Mouth: Mucous membranes  are moist.  Eyes:     Pupils: Pupils are equal, round, and reactive to light.  Neck:     Vascular: No carotid bruit.  Cardiovascular:     Rate and Rhythm: Normal rate and regular rhythm.     Pulses: Normal pulses.     Heart sounds: Normal heart sounds.  Pulmonary:     Effort: Pulmonary effort is normal. No respiratory distress.     Breath sounds: Normal breath sounds. No wheezing.  Abdominal:     General: Bowel sounds are normal. There is distension.     Palpations: Abdomen is soft. There is no hepatomegaly, splenomegaly or mass.     Tenderness: There is no guarding.     Hernia: No hernia is present.  Musculoskeletal:     Cervical back: Neck supple.     Right lower leg: No edema.     Left lower leg: No edema.  Skin:    Findings: No rash.  Neurological:     Mental Status: He is alert and oriented to person, place, and time.     Motor: No weakness.  Psychiatric:        Mood and Affect: Mood normal.        Behavior: Behavior normal.     BP (!) 142/83   Pulse 85   Ht 5\' 7"  (1.702 m)   Wt 296 lb 14.4 oz (134.7 kg)   BMI 46.50 kg/m  Wt Readings from Last 3 Encounters:  08/11/20 296 lb 14.4 oz (134.7 kg)  06/04/20 300 lb (136.1 kg)  02/26/20 (!) 307 lb (139.3 kg)     Health Maintenance Due  Topic Date Due  . Hepatitis C Screening  Never done  . COVID-19 Vaccine (1) Never done  . HIV Screening  Never done  . TETANUS/TDAP  Never done  . COLONOSCOPY (Pts 45-35yrs Insurance coverage will need to be confirmed)  Never done    There are no preventive care reminders to display for this patient.  No results found for: TSH Lab Results  Component Value Date   WBC 8.6 11/07/2019   HGB 16.4 11/07/2019   HCT 47.8 11/07/2019   MCV 85.7 11/07/2019   PLT 191 11/07/2019   Lab Results  Component Value Date   NA 136 02/23/2016   K 4.4 02/23/2016   CO2 28 02/23/2016   GLUCOSE 107 (H) 02/23/2016   BUN 20 02/23/2016   CREATININE 1.36 (H) 02/23/2016   BILITOT 0.3 02/23/2016    ALKPHOS 81 02/23/2016   AST 31 02/23/2016   ALT 41 02/23/2016   PROT 7.6 02/23/2016  ALBUMIN 4.3 02/23/2016   CALCIUM 8.6 (L) 02/23/2016   ANIONGAP 5 02/23/2016   GFR 80.30 06/23/2014   Lab Results  Component Value Date   CHOL 183 06/23/2014   Lab Results  Component Value Date   HDL 44.70 06/23/2014   Lab Results  Component Value Date   LDLCALC 113 (H) 06/23/2014   Lab Results  Component Value Date   TRIG 125.0 06/23/2014   Lab Results  Component Value Date   CHOLHDL 4 06/23/2014   No results found for: HGBA1C    Assessment & Plan:   Problem List Items Addressed This Visit      Cardiovascular and Mediastinum   Coronary artery disease, non-occlusive - Primary    Patient denies any chest pain or shortness of breath he is using his CPAP machine.  He was advised to lose weight.      RVF (right ventricular failure) ([place])    We will get an echocardiogram.        Respiratory   OSA (obstructive sleep apnea)    Patient is using CPAP machine.  He was advised to lose at least 50 pounds of weight        Other   Hyperlipidemia    Hypercholesterolemia  I advised the patient to follow Mediterranean diet This diet is rich in fruits vegetables and whole grain, and This diet is also rich in fish and lean meat Patient should also eat a handful of almonds or walnuts daily Recent heart study indicated that average follow-up on this kind of diet reduces the cardiovascular mortality by 50 to 70%==      Smoking    - I instructed the patient to stop smoking and provided them with smoking cessation materials.  - I informed the patient that smoking puts them at increased risk for cancer, COPD, hypertension, and more.  - Informed the patient to seek help if they begin to have trouble breathing, develop chest pain, start to cough up blood, feel faint, or pass out.      Morbid obesity ([place])    - I encouraged the patient to lose weight.  - I educated them on making healthy  dietary choices including eating more fruits and vegetables and less fried foods. - I encouraged the patient to exercise more, and educated on the benefits of exercise including weight loss, diabetes prevention, and hypertension prevention.   Dietary counseling with a registered dietician  Referral to a weight management support group (e.g. Weight Watchers, Overeaters Anonymous)  If your BMI is greater than 29 or you have gained more than 15 pounds you should work on weight loss.  Attend a healthy cooking class          No orders of the defined types were placed in this encounter.   Follow-up: No follow-ups on file.  Patient was advised to make a follow-up appointment TSH. lipid panel hemoglobin AIC and PSA.  He was also advised to get if follow-up chest CT scan because he has a right pulmonary nodule 3.9 mm. Patient will have an echocardiogram and stress test scheduled evaluate coronary artery disease.  Patient was advised to lose 50 pounds of weight. Patient is advised to quit smoking. He does not drink much alcohol or something he takes beer when he is not working  Cletis Athens, MD

## 2020-08-11 NOTE — Assessment & Plan Note (Signed)
Patient is using CPAP machine.  He was advised to lose at least 50 pounds of weight

## 2020-08-11 NOTE — Assessment & Plan Note (Signed)
-   I instructed the patient to stop smoking and provided them with smoking cessation materials.  - I informed the patient that smoking puts them at increased risk for cancer, COPD, hypertension, and more.  - Informed the patient to seek help if they begin to have trouble breathing, develop chest pain, start to cough up blood, feel faint, or pass out.  

## 2020-08-11 NOTE — Assessment & Plan Note (Signed)

## 2020-08-11 NOTE — Assessment & Plan Note (Signed)
Hypercholesterolemia  I advised the patient to follow Mediterranean diet This diet is rich in fruits vegetables and whole grain, and This diet is also rich in fish and lean meat Patient should also eat a handful of almonds or walnuts daily Recent heart study indicated that average follow-up on this kind of diet reduces the cardiovascular mortality by 50 to 70%== 

## 2020-09-16 ENCOUNTER — Other Ambulatory Visit (INDEPENDENT_AMBULATORY_CARE_PROVIDER_SITE_OTHER): Payer: 59

## 2020-09-16 DIAGNOSIS — I420 Dilated cardiomyopathy: Secondary | ICD-10-CM | POA: Diagnosis not present

## 2020-09-16 DIAGNOSIS — R0602 Shortness of breath: Secondary | ICD-10-CM

## 2020-09-17 NOTE — Progress Notes (Unsigned)
Established Patient Office Visit  Subjective:  Patient ID: Raymond Humphrey, male    DOB: [birth year]  Age: 60 y.o. MRN: [id number]  CC: No chief complaint on file.   HPI  Raymond Humphrey presents for echocardiogram and office visit  Past Medical History:  Diagnosis Date  . Coronary artery disease    CAD: MI in 1/09 in [place]. Had BMS x 2 to the RCA at that time. 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 Nuclear Stress Test (01/2012):  diaph atten, no ischemia, EF 56%.    Marland Kitchen Hx of cardiovascular stress test    GXT (2/16): no ischemic EKG changes  . Hyperlipidemia   . Hypertension   . Mild pulmonic regurgitation and RV dilation by prior echocardiogram    Dilated RV on 2009 echo. However, echo (10/12) showed EF 55%, basal to mid inferior hypokinesis, grade II diastolic dysfunction, normal RV size and systolic function, PA systolic pressure;  Echocardiogram (01/28/11):  EF 55%, inf mild HK, Gr 2 DD, mild LAE, mild RAE  . Obesity   . OSA (obstructive sleep apnea)    on CPAP  . Polycythemia   . Smoking     Past Surgical History:  Procedure Laterality Date  . APPENDECTOMY    . PERCUTANEOUS CORONARY STENT INTERVENTION (PCI-S)  2009   BMS x 2 to RCA - hospital in [place]     Family History  Problem Relation Age of Onset  . Heart attack Father   . Heart attack Cousin   . Heart attack Maternal Grandfather     Social History   Socioeconomic History  . Marital status: Married    Spouse name: Not on file  . Number of children: Not on file  . Years of education: Not on file  . Highest education level: Not on file  Occupational History  . Occupation: Truck Education administrator: [employer]  Tobacco Use  . Smoking status: Current Every Day Smoker    Packs/day: 0.75    Years: 35.00    Pack years: 26.25    Types: Cigarettes  . Smokeless tobacco: Never Used  Vaping Use  . Vaping Use: Never used   Substance and Sexual Activity  . Alcohol use: Yes    Comment: 6pack a week when home  . Drug use: No  . Sexual activity: Yes  Other Topics Concern  . Not on file  Social History Narrative  . Not on file   Social Determinants of Health   Financial Resource Strain: Not on file  Food Insecurity: Not on file  Transportation Needs: Not on file  Physical Activity: Not on file  Stress: Not on file  Social Connections: Not on file  Intimate Partner Violence: Not on file     Current Outpatient Medications:  .  aspirin EC 81 MG tablet, Take 1 tablet (81 mg total) by mouth daily., Disp: , Rfl:  .  atorvastatin (LIPITOR) 40 MG tablet, Take 1 tablet (40 mg total) by mouth daily., Disp: 30 tablet, Rfl: 11 .  lisinopril (ZESTRIL) 20 MG tablet, Take 20 mg by mouth daily., Disp: , Rfl:  .  nitroGLYCERIN (NITROSTAT) 0.4 MG SL tablet, Place 1 tablet (0.4 mg total) under the tongue every 5 (five) minutes as needed for chest pain. (Patient not taking: Reported on 05/02/2019), Disp: 25 tablet, Rfl: 6 .  sildenafil (VIAGRA) 50 MG tablet, TAKE 1 TABLET BY MOUTH AS  NEEDED FOR ERECTILE DYSFUNCTION, Disp: 30 tablet, Rfl: 6   No Known Allergies  ROS Review of Systems complains of shortness of breath on exertion.  Patient is obese he denies any chest pain or passing out spell.    Objective:    Physical Exam on physical examination patient is a well-nourished in no acute distress HEENT examination is normal neck is supple.  Jugular venous pressure is not elevated Thyroid is not enlarged without any goiter or nodules. No lymphadenopathy is noted. Cardiovascular system examination revealed apical impulse to be palpable in the fifth intercostal space first heart sounds normal second heart sounds normal no murmur is audible.  On examination of the chest chest is clear on auscultation resonant on percussion.  Abdominal examination reveals abdomen to be soft nontender there is no hepatosplenomegaly bowel  sounds are audible no tenderness rigidity or rebound tenderness noted. There is no calf tenderness or pedal edema. Neurological examination is un remarkable psychiatric examination is unremarkable. Locomotor system examination is unremarkable  There were no vitals taken for this visit. Wt Readings from Last 3 Encounters:  08/11/20 296 lb 14.4 oz (134.7 kg)  06/04/20 300 lb (136.1 kg)  02/26/20 (!) 307 lb (139.3 kg)     Health Maintenance Due  Topic Date Due  . COVID-19 Vaccine (1) Never done  . HIV Screening  Never done  . Hepatitis C Screening  Never done  . TETANUS/TDAP  Never done  . COLONOSCOPY (Pts 45-35yrs Insurance coverage will need to be confirmed)  Never done  . Zoster Vaccines- Shingrix (1 of 2) Never done    There are no preventive care reminders to display for this patient.  No results found for: TSH Lab Results  Component Value Date   WBC 8.6 11/07/2019   HGB 16.4 11/07/2019   HCT 47.8 11/07/2019   MCV 85.7 11/07/2019   PLT 191 11/07/2019   Lab Results  Component Value Date   NA 136 02/23/2016   K 4.4 02/23/2016   CO2 28 02/23/2016   GLUCOSE 107 (H) 02/23/2016   BUN 20 02/23/2016   CREATININE 1.36 (H) 02/23/2016   BILITOT 0.3 02/23/2016   ALKPHOS 81 02/23/2016   AST 31 02/23/2016   ALT 41 02/23/2016   PROT 7.6 02/23/2016   ALBUMIN 4.3 02/23/2016   CALCIUM 8.6 (L) 02/23/2016   ANIONGAP 5 02/23/2016   GFR 80.30 06/23/2014   Lab Results  Component Value Date   CHOL 183 06/23/2014   Lab Results  Component Value Date   HDL 44.70 06/23/2014   Lab Results  Component Value Date   LDLCALC 113 (H) 06/23/2014   Lab Results  Component Value Date   TRIG 125.0 06/23/2014   Lab Results  Component Value Date   CHOLHDL 4 06/23/2014   No results found for: HGBA1C    Assessment & Plan:   Problem List Items Addressed This Visit   None   [facility] [address] [place],  [address] Phone: [phone] Fax:  [phone]  Transthoracic Echocardiogram Note  Raymond Humphrey [id number] 01-06-1961  Procedure: Transthoracic Echocardiogram Indications: Shortness of breath Verbal Consent: Obtained  Procedure Details two-dimensional and M-mode echocardiogram was obtained in the long axis short axis and apical four-chamber view. Echocardiogram revealed poor acoustic windows, left ventricular ejection fraction is 50%.  Right ventricular function is good.  There is no pericardial effusion.  No blood clots are seen.  Ventricular cavity.  Mitral aortic and tricuspid  valves are normal.  Left atrial size appears to be normal.  No masses or vegetations are seen.  Technical quality: good  Resting Measurements: Within normal limits  Left Ventrical: Within normal limits  Mitral Valve: Within normal limits  Aortic Valve: Normal tricuspid  Tricuspid Valve: Normal  Pulmonic Valve: Not seen  Left Atrium/ Left atrial appendage: Normal size  Atrial septum:   Aorta: Aortic root is normal   Complications: No apparent complications Patient did tolerate procedure well.  Cletis Athens, MD    No orders of the defined types were placed in this encounter.   Follow-up: No follow-ups on file.    Cletis Athens, MD

## 2020-09-23 ENCOUNTER — Other Ambulatory Visit: Payer: Self-pay

## 2020-09-23 ENCOUNTER — Other Ambulatory Visit (INDEPENDENT_AMBULATORY_CARE_PROVIDER_SITE_OTHER): Payer: 59

## 2020-09-23 DIAGNOSIS — F17200 Nicotine dependence, unspecified, uncomplicated: Secondary | ICD-10-CM

## 2020-09-23 DIAGNOSIS — G4733 Obstructive sleep apnea (adult) (pediatric): Secondary | ICD-10-CM

## 2020-09-23 DIAGNOSIS — E785 Hyperlipidemia, unspecified: Secondary | ICD-10-CM

## 2020-09-23 DIAGNOSIS — I2510 Atherosclerotic heart disease of native coronary artery without angina pectoris: Secondary | ICD-10-CM

## 2020-09-23 NOTE — Assessment & Plan Note (Signed)
Hypercholesterolemia  I advised the patient to follow Mediterranean diet This diet is rich in fruits vegetables and whole grain, and This diet is also rich in fish and lean meat Patient should also eat a handful of almonds or walnuts daily Recent heart study indicated that average follow-up on this kind of diet reduces the cardiovascular mortality by 50 to 70%== 

## 2020-09-23 NOTE — Assessment & Plan Note (Signed)
-   I instructed the patient to stop smoking and provided them with smoking cessation materials.  - I informed the patient that smoking puts them at increased risk for cancer, COPD, hypertension, and more.  - Informed the patient to seek help if they begin to have trouble breathing, develop chest pain, start to cough up blood, feel faint, or pass out.  

## 2020-09-23 NOTE — Assessment & Plan Note (Signed)
Stress test did not show any ST changes consistent with ischemia and maximum heart rate of 133.  He did not have any angina or arrhythmia, preventive care discussed with the patient

## 2020-09-23 NOTE — Assessment & Plan Note (Signed)
Patient uses CPAP machine.

## 2020-09-23 NOTE — Progress Notes (Unsigned)
Established Patient Office Visit  Subjective:  Patient ID: Raymond Humphrey, male    DOB: January 02, 1961  Age: 60 y.o. MRN: [id number]  CC: No chief complaint on file.   HPI  Raymond Humphrey presents for stress test.  He does not have any chest pain he has been walking   regulary in [place].  There is no history of syncope or dizziness     Past Medical History:  Diagnosis Date  . Coronary artery disease    CAD: MI in 1/09 in [place]. Had BMS x 2 to the RCA at that time. 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 Nuclear Stress Test (01/2012):  diaph atten, no ischemia, EF 56%.    Marland Kitchen Hx of cardiovascular stress test    GXT (2/16): no ischemic EKG changes  . Hyperlipidemia   . Hypertension   . Mild pulmonic regurgitation and RV dilation by prior echocardiogram    Dilated RV on 2009 echo. However, echo (10/12) showed EF 55%, basal to mid inferior hypokinesis, grade II diastolic dysfunction, normal RV size and systolic function, PA systolic pressure;  Echocardiogram (01/28/11):  EF 55%, inf mild HK, Gr 2 DD, mild LAE, mild RAE  . Obesity   . OSA (obstructive sleep apnea)    on CPAP  . Polycythemia   . Smoking     Past Surgical History:  Procedure Laterality Date  . APPENDECTOMY    . PERCUTANEOUS CORONARY STENT INTERVENTION (PCI-S)  2009   BMS x 2 to RCA - hospital in [place]     Family History  Problem Relation Age of Onset  . Heart attack Father   . Heart attack Cousin   . Heart attack Maternal Grandfather     Social History   Socioeconomic History  . Marital status: Married    Spouse name: Not on file  . Number of children: Not on file  . Years of education: Not on file  . Highest education level: Not on file  Occupational History  . Occupation: Truck Education administrator: [employer]  Tobacco Use  . Smoking status: Current Every Day Smoker    Packs/day: 0.75    Years: 35.00     Pack years: 26.25    Types: Cigarettes  . Smokeless tobacco: Never Used  Vaping Use  . Vaping Use: Never used  Substance and Sexual Activity  . Alcohol use: Yes    Comment: 6pack a week when home  . Drug use: No  . Sexual activity: Yes  Other Topics Concern  . Not on file  Social History Narrative  . Not on file   Social Determinants of Health   Financial Resource Strain: Not on file  Food Insecurity: Not on file  Transportation Needs: Not on file  Physical Activity: Not on file  Stress: Not on file  Social Connections: Not on file  Intimate Partner Violence: Not on file     Current Outpatient Medications:  .  aspirin EC 81 MG tablet, Take 1 tablet (81 mg total) by mouth daily., Disp: , Rfl:  .  atorvastatin (LIPITOR) 40 MG tablet, Take 1 tablet (40 mg total) by mouth daily., Disp: 30 tablet, Rfl: 11 .  lisinopril (ZESTRIL) 20 MG tablet, Take 20 mg by mouth daily., Disp: , Rfl:  .  nitroGLYCERIN (NITROSTAT) 0.4 MG SL tablet, Place 1 tablet (0.4 mg total) under the tongue every 5 (five) minutes as  needed for chest pain. (Patient not taking: Reported on 05/02/2019), Disp: 25 tablet, Rfl: 6 .  sildenafil (VIAGRA) 50 MG tablet, TAKE 1 TABLET BY MOUTH AS NEEDED FOR ERECTILE DYSFUNCTION, Disp: 30 tablet, Rfl: 6   No Known Allergies  ROS Review of Systems  Constitutional: Negative.   HENT: Negative.   Eyes: Negative.   Respiratory: Negative.   Cardiovascular: Negative.   Gastrointestinal: Negative.   Endocrine: Negative.   Genitourinary: Negative.   Musculoskeletal: Negative.   Skin: Negative.   Allergic/Immunologic: Negative.   Neurological: Negative.   Hematological: Negative.   Psychiatric/Behavioral: Negative.   All other systems reviewed and are negative.     Objective:    Physical Exam Cardiovascular:     Rate and Rhythm: Normal rate and regular rhythm.     Pulses: Normal pulses.  Pulmonary:     Effort: No respiratory distress.     Breath sounds: No  wheezing.  Musculoskeletal:        General: Normal range of motion.  Neurological:     General: No focal deficit present.     There were no vitals taken for this visit. Wt Readings from Last 3 Encounters:  08/11/20 296 lb 14.4 oz (134.7 kg)  06/04/20 300 lb (136.1 kg)  02/26/20 (!) 307 lb (139.3 kg)     Health Maintenance Due  Topic Date Due  . COVID-19 Vaccine (1) Never done  . HIV Screening  Never done  . Hepatitis C Screening  Never done  . TETANUS/TDAP  Never done  . COLONOSCOPY (Pts 45-24yrs Insurance coverage will need to be confirmed)  Never done  . Zoster Vaccines- Shingrix (1 of 2) Never done    There are no preventive care reminders to display for this patient.  No results found for: TSH Lab Results  Component Value Date   WBC 8.6 11/07/2019   HGB 16.4 11/07/2019   HCT 47.8 11/07/2019   MCV 85.7 11/07/2019   PLT 191 11/07/2019   Lab Results  Component Value Date   NA 136 02/23/2016   K 4.4 02/23/2016   CO2 28 02/23/2016   GLUCOSE 107 (H) 02/23/2016   BUN 20 02/23/2016   CREATININE 1.36 (H) 02/23/2016   BILITOT 0.3 02/23/2016   ALKPHOS 81 02/23/2016   AST 31 02/23/2016   ALT 41 02/23/2016   PROT 7.6 02/23/2016   ALBUMIN 4.3 02/23/2016   CALCIUM 8.6 (L) 02/23/2016   ANIONGAP 5 02/23/2016   GFR 80.30 06/23/2014   Lab Results  Component Value Date   CHOL 183 06/23/2014   Lab Results  Component Value Date   HDL 44.70 06/23/2014   Lab Results  Component Value Date   LDLCALC 113 (H) 06/23/2014   Lab Results  Component Value Date   TRIG 125.0 06/23/2014   Lab Results  Component Value Date   CHOLHDL 4 06/23/2014   No results found for: HGBA1C    Assessment & Plan:   Problem List Items Addressed This Visit      Cardiovascular and Mediastinum   Coronary artery disease, non-occlusive - Primary    Stress test did not show any ST changes consistent with ischemia and maximum heart rate of 133.  He did not have any angina or arrhythmia,  preventive care discussed with the patient        Respiratory   OSA (obstructive sleep apnea)    Patient uses CPAP machine.        Other   Hyperlipidemia    Hypercholesterolemia  I advised the patient to follow Mediterranean diet This diet is rich in fruits vegetables and whole grain, and This diet is also rich in fish and lean meat Patient should also eat a handful of almonds or walnuts daily Recent heart study indicated that average follow-up on this kind of diet reduces the cardiovascular mortality by 50 to 70%==      Smoking    - I instructed the patient to stop smoking and provided them with smoking cessation materials.  - I informed the patient that smoking puts them at increased risk for cancer, COPD, hypertension, and more.  - Informed the patient to seek help if they begin to have trouble breathing, develop chest pain, start to cough up blood, feel faint, or pass out.          PATIENT ID: Raymond Humphrey, male     DOB: Jun 13, 1960, 60 y.o.     MRN: [id number]   Procedure Note Stress Test  Reason for Study: Shortness of breath   Study:  Treadmill Stress Test  Pre-test ECG:  normal; normal EKG, normal sinus rhythm.  Level of Stress:  ***% age-predicted max HR    Functional Capacity:  normal  Abnormal Symptoms:  shortness of breath  Heart Rate Response:  normal  BP Response:   normal  Stress ECG:  no ECG changes; {ekg findings:315101::"normal EKG, normal sinus rhythm","unchanged from previous tracings"}.   Impression:   Low probability of significant CAD.   The patient was exercised according to the modified Bruce protocol.  Patient was exercised at the 1.7 MPH at 10% grade.  Exercise test was stopped at the end of 11 minutes due to shortness of breath.  Patient did not have any chest pain.  He did not have any ST changes or myocardial ischemia.  He did not have any arrhythmia.  He did not have any claudication.  He did not get dizzy at the exercise  termination.  Exercise test suggests low probability of coronary artery disease.  He was suggested to lose about 75 pounds of weight.  Maximum heart rate reached.  It is 133 Blood pressure achieved is 200/100. BP140/80 before he was discharged.   Interpreted by: Cletis Athens, MD 09/23/20 12:55 PM      No orders of the defined types were placed in this encounter.   Follow-up: No follow-ups on file.    Cletis Athens, MD

## 2020-09-23 NOTE — Patient Instructions (Signed)

## 2020-10-13 ENCOUNTER — Other Ambulatory Visit: Payer: Self-pay | Admitting: Internal Medicine

## 2020-11-08 ENCOUNTER — Other Ambulatory Visit: Payer: Self-pay | Admitting: *Deleted

## 2020-11-08 DIAGNOSIS — D751 Secondary polycythemia: Secondary | ICD-10-CM

## 2020-11-09 ENCOUNTER — Inpatient Hospital Stay: Payer: 59

## 2020-11-09 ENCOUNTER — Inpatient Hospital Stay: Payer: 59 | Attending: Oncology

## 2020-11-09 ENCOUNTER — Inpatient Hospital Stay: Payer: 59 | Admitting: Oncology

## 2021-01-01 ENCOUNTER — Ambulatory Visit
Admission: RE | Admit: 2021-01-01 | Discharge: 2021-01-01 | Disposition: A | Discharge status: Home / Self Care | Payer: 59 | Source: Ambulatory Visit | Attending: Urology | Admitting: Urology

## 2021-01-01 DIAGNOSIS — D751 Secondary polycythemia: Secondary | ICD-10-CM | POA: Insufficient documentation

## 2021-01-08 ENCOUNTER — Ambulatory Visit
Admission: RE | Admit: 2021-01-08 | Discharge: 2021-01-08 | Disposition: A | Discharge status: Home / Self Care | Payer: 59 | Source: Ambulatory Visit | Attending: Urology | Admitting: Urology

## 2021-01-08 ENCOUNTER — Other Ambulatory Visit: Payer: Self-pay

## 2021-01-08 DIAGNOSIS — D750 Familial erythrocytosis: Secondary | ICD-10-CM | POA: Insufficient documentation

## 2021-01-08 LAB — CBC
HCT: 48.9 % (ref 39.0–52.0)
Hemoglobin: 16.4 g/dL (ref 13.0–17.0)
MCH: 31 pg (ref 26.0–34.0)
MCHC: 33.5 g/dL (ref 30.0–36.0)
MCV: 92.4 fL (ref 80.0–100.0)
Platelets: 193 10*3/uL (ref 150–400)
RBC: 5.29 MIL/uL (ref 4.22–5.81)
RDW: 13.9 % (ref 11.5–15.5)
WBC: 9 10*3/uL (ref 4.0–10.5)
nRBC: 0 % (ref 0.0–0.2)

## 2021-02-01 ENCOUNTER — Ambulatory Visit: Payer: 59 | Admitting: Internal Medicine

## 2021-02-15 ENCOUNTER — Encounter: Payer: Self-pay | Admitting: Internal Medicine

## 2021-02-15 ENCOUNTER — Ambulatory Visit: Payer: 59 | Admitting: Internal Medicine

## 2021-02-15 ENCOUNTER — Other Ambulatory Visit: Payer: Self-pay

## 2021-02-15 VITALS — BP 132/81 | HR 79 | Ht 67.0 in | Wt 286.5 lb

## 2021-02-15 DIAGNOSIS — E785 Hyperlipidemia, unspecified: Secondary | ICD-10-CM

## 2021-02-15 DIAGNOSIS — F17200 Nicotine dependence, unspecified, uncomplicated: Secondary | ICD-10-CM | POA: Diagnosis not present

## 2021-02-15 DIAGNOSIS — G4733 Obstructive sleep apnea (adult) (pediatric): Secondary | ICD-10-CM | POA: Diagnosis not present

## 2021-02-15 DIAGNOSIS — I2510 Atherosclerotic heart disease of native coronary artery without angina pectoris: Secondary | ICD-10-CM

## 2021-02-15 DIAGNOSIS — D751 Secondary polycythemia: Secondary | ICD-10-CM

## 2021-02-15 MED ORDER — LISINOPRIL 20 MG PO TABS: 20.0000 mg | ORAL_TABLET | Freq: Every day | ORAL | 3 refills | Status: DC

## 2021-02-15 NOTE — Assessment & Plan Note (Signed)
Stable at the present time.  He does not have any anginaor chest pain

## 2021-02-15 NOTE — Assessment & Plan Note (Signed)
Patient uses CPAP machine he was advised to lose weight.

## 2021-02-15 NOTE — Progress Notes (Signed)
Established Patient Office Visit  Subjective:  Patient ID: Raymond Humphrey, male    DOB: 05/21/1960  Age: 61 y.o. 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     HPI  Raymond Humphrey presents for check up  Past Medical History:  Diagnosis Date   Coronary artery disease    CAD: MI in 1/09 in [place].  Had BMS x 2 to the RCA at that time.  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  Nuclear Stress Test (01/2012):  diaph atten, no ischemia, EF 56%.      Hx of cardiovascular stress test    GXT (2/16): no ischemic EKG changes   Hyperlipidemia    Hypertension    Mild pulmonic regurgitation and RV dilation by prior echocardiogram    Dilated RV on 2009 echo.  However, echo (10/12) showed EF 55%, basal to mid inferior hypokinesis, grade II diastolic dysfunction, normal RV size and systolic function, PA systolic pressure;  Echocardiogram (01/28/11):  EF 55%, inf mild HK, Gr 2 DD, mild LAE, mild RAE   Obesity    OSA (obstructive sleep apnea)    on CPAP   Polycythemia    Smoking     Past Surgical History:  Procedure Laterality Date   APPENDECTOMY     PERCUTANEOUS CORONARY STENT INTERVENTION (PCI-S)  2009   BMS x 2 to RCA - hospital in [place]     Family History  Problem Relation Age of Onset   Heart attack Father    Heart attack Cousin    Heart attack Maternal Grandfather     Social History   Socioeconomic History   Marital status: Married    Spouse name: Not on file   Number of children: Not on file   Years of education: Not on file   Highest education level: Not on file  Occupational History   Occupation: Truck Education administrator: [employer]  Tobacco Use   Smoking status: Every Day    Packs/day: 0.50    Years: 35.00    Pack years: 17.50    Types: Cigarettes    Smokeless tobacco: Never  Vaping Use   Vaping Use: Never used  Substance and Sexual Activity   Alcohol use: Yes    Comment: 6pack a week when home   Drug use: No   Sexual activity: Yes  Other Topics Concern   Not on file  Social History Narrative   Not on file   Social Determinants of Health   Financial Resource Strain: Not on file  Food Insecurity: Not on file  Transportation Needs: Not on file  Physical Activity: Not on file  Stress: Not on file  Social Connections: Not on file  Intimate Partner Violence: Not on file     Current Outpatient Medications:    aspirin EC 81 MG tablet, Take 1 tablet (81 mg total) by mouth daily., Disp: , Rfl:    atorvastatin (LIPITOR) 40 MG tablet, Take 1 tablet (40 mg total) by mouth daily., Disp: 30 tablet, Rfl: 11   lisinopril (ZESTRIL) 20 MG tablet, Take 1 tablet (20 mg total) by mouth daily., Disp: 90 tablet, Rfl: 3   nitroGLYCERIN (NITROSTAT) 0.4 MG SL tablet, Place 1 tablet (0.4 mg total) under the tongue every  5 (five) minutes as needed for chest pain. (Patient not taking: Reported on 05/02/2019), Disp: 25 tablet, Rfl: 6   sildenafil (VIAGRA) 50 MG tablet, TAKE 1 TABLET BY MOUTH AS NEEDED FOR ERECTILE DYSFUNCTION, Disp: 30 tablet, Rfl: 6   No Known Allergies  ROS Review of Systems  Constitutional: Negative.   HENT: Negative.    Eyes: Negative.   Respiratory: Negative.    Cardiovascular: Negative.   Gastrointestinal: Negative.   Endocrine: Negative.   Genitourinary: Negative.   Musculoskeletal: Negative.   Skin: Negative.   Allergic/Immunologic: Negative.   Neurological: Negative.   Hematological: Negative.   Psychiatric/Behavioral: Negative.    All other systems reviewed and are negative.    Objective:    Physical Exam Vitals reviewed.  Constitutional:      Appearance: Normal appearance.  HENT:     Mouth/Throat:     Mouth: Mucous membranes are moist.  Eyes:     Pupils: Pupils are equal, round, and reactive to light.   Neck:     Vascular: No carotid bruit.  Cardiovascular:     Rate and Rhythm: Normal rate and regular rhythm.     Pulses: Normal pulses.     Heart sounds: Normal heart sounds.  Pulmonary:     Effort: Pulmonary effort is normal.     Breath sounds: Normal breath sounds.  Abdominal:     General: Bowel sounds are normal.     Palpations: Abdomen is soft. There is no hepatomegaly, splenomegaly or mass.     Tenderness: There is no abdominal tenderness.     Hernia: No hernia is present.  Musculoskeletal:     Cervical back: Neck supple.     Right lower leg: No edema.     Left lower leg: No edema.  Skin:    Findings: No rash.  Neurological:     Mental Status: He is alert and oriented to person, place, and time.     Motor: No weakness.  Psychiatric:        Mood and Affect: Mood normal.        Behavior: Behavior normal.    BP 132/81   Pulse 79   Ht 5\' 7"  (1.702 m)   Wt 286 lb 8 oz (130 kg)   BMI 44.87 kg/m  Wt Readings from Last 3 Encounters:  02/15/21 286 lb 8 oz (130 kg)  08/11/20 296 lb 14.4 oz (134.7 kg)  06/04/20 300 lb (136.1 kg)     Health Maintenance Due  Topic Date Due   COVID-19 Vaccine (1) Never done   Pneumococcal Vaccine 15-48 Years old (1 - PCV) Never done   HIV Screening  Never done   Hepatitis C Screening  Never done   TETANUS/TDAP  Never done   COLONOSCOPY (Pts 45-62yrs Insurance coverage will need to be confirmed)  Never done   Zoster Vaccines- Shingrix (1 of 2) Never done   INFLUENZA VACCINE  Never done    There are no preventive care reminders to display for this patient.  No results found for: TSH Lab Results  Component Value Date   WBC 9.0 01/08/2021   HGB 16.4 01/08/2021   HCT 48.9 01/08/2021   MCV 92.4 01/08/2021   PLT 193 01/08/2021   Lab Results  Component Value Date   NA 136 02/23/2016   K 4.4 02/23/2016   CO2 28 02/23/2016   GLUCOSE 107 (H) 02/23/2016   BUN 20 02/23/2016   CREATININE 1.36 (H) 02/23/2016   BILITOT 0.3 02/23/2016  ALKPHOS 81 02/23/2016   AST 31 02/23/2016   ALT 41 02/23/2016   PROT 7.6 02/23/2016   ALBUMIN 4.3 02/23/2016   CALCIUM 8.6 (L) 02/23/2016   ANIONGAP 5 02/23/2016   GFR 80.30 06/23/2014   Lab Results  Component Value Date   CHOL 183 06/23/2014   Lab Results  Component Value Date   HDL 44.70 06/23/2014   Lab Results  Component Value Date   LDLCALC 113 (H) 06/23/2014   Lab Results  Component Value Date   TRIG 125.0 06/23/2014   Lab Results  Component Value Date   CHOLHDL 4 06/23/2014   No results found for: HGBA1C    Assessment & Plan:   Problem List Items Addressed This Visit       Cardiovascular and Mediastinum   Coronary artery disease, non-occlusive - Primary    Stable at the present time.  He does not have any anginaor chest pain      Relevant Medications   lisinopril (ZESTRIL) 20 MG tablet     Respiratory   OSA (obstructive sleep apnea)    Patient uses CPAP machine he was advised to lose weight.        Other   Hyperlipidemia    Hypercholesterolemia  I advised the patient to follow Mediterranean diet This diet is rich in fruits vegetables and whole grain, and This diet is also rich in fish and lean meat Patient should also eat a handful of almonds or walnuts daily Recent heart study indicated that average follow-up on this kind of diet reduces the cardiovascular mortality by 50 to 70%==      Relevant Medications   lisinopril (ZESTRIL) 20 MG tablet   Smoking    - I instructed the patient to stop smoking and provided them with smoking cessation materials.  - I informed the patient that smoking puts them at increased risk for cancer, COPD, hypertension, and more.  - Informed the patient to seek help if they begin to have trouble breathing, develop chest pain, start to cough up blood, feel faint, or pass out.      Morbid obesity ([place])    Behavioral modification strategies: increasing lean protein intake, decreasing simple carbohydrates,  increasing vegetables, increasing water intake, decreasing eating out, no skipping meals, meal planning and cooking strategies, keeping healthy foods in the home and planning for success.      Polycythemia    Stable at the present time     Patient is okay to drive a commercial vehicle  Meds ordered this encounter  Medications   lisinopril (ZESTRIL) 20 MG tablet    Sig: Take 1 tablet (20 mg total) by mouth daily.    Dispense:  90 tablet    Refill:  3    Follow-up: No follow-ups on file.    Cletis Athens, MD

## 2021-02-15 NOTE — Assessment & Plan Note (Signed)
Hypercholesterolemia  I advised the patient to follow Mediterranean diet This diet is rich in fruits vegetables and whole grain, and This diet is also rich in fish and lean meat Patient should also eat a handful of almonds or walnuts daily Recent heart study indicated that average follow-up on this kind of diet reduces the cardiovascular mortality by 50 to 70%== 

## 2021-02-15 NOTE — Assessment & Plan Note (Signed)
-   I instructed the patient to stop smoking and provided them with smoking cessation materials.  - I informed the patient that smoking puts them at increased risk for cancer, COPD, hypertension, and more.  - Informed the patient to seek help if they begin to have trouble breathing, develop chest pain, start to cough up blood, feel faint, or pass out.  

## 2021-02-15 NOTE — Assessment & Plan Note (Signed)
Stable at the present time. 

## 2021-02-15 NOTE — Assessment & Plan Note (Signed)
Behavioral modification strategies: increasing lean protein intake, decreasing simple carbohydrates, increasing vegetables, increasing water intake, decreasing eating out, no skipping meals, meal planning and cooking strategies, keeping healthy foods in the home and planning for success. 

## 2021-06-28 ENCOUNTER — Other Ambulatory Visit: Payer: Self-pay | Admitting: *Deleted

## 2021-06-28 MED ORDER — SILDENAFIL CITRATE 50 MG PO TABS: ORAL_TABLET | ORAL | 6 refills | Status: DC

## 2021-07-02 ENCOUNTER — Other Ambulatory Visit: Payer: Self-pay

## 2021-07-02 ENCOUNTER — Other Ambulatory Visit: Payer: Self-pay | Admitting: Internal Medicine

## 2021-07-28 ENCOUNTER — Telehealth: Payer: Self-pay | Admitting: Acute Care

## 2021-07-28 NOTE — Telephone Encounter (Signed)
Attempted to reach pt to schedule annual LDCT-LVMM 

## 2021-08-26 ENCOUNTER — Other Ambulatory Visit: Payer: Self-pay

## 2021-08-26 DIAGNOSIS — F17210 Nicotine dependence, cigarettes, uncomplicated: Secondary | ICD-10-CM

## 2021-08-26 DIAGNOSIS — Z87891 Personal history of nicotine dependence: Secondary | ICD-10-CM

## 2021-08-26 DIAGNOSIS — Z122 Encounter for screening for malignant neoplasm of respiratory organs: Secondary | ICD-10-CM

## 2021-09-06 ENCOUNTER — Ambulatory Visit
Admission: RE | Admit: 2021-09-06 | Discharge: 2021-09-06 | Disposition: A | Discharge status: Home / Self Care | Payer: 59 | Source: Ambulatory Visit | Attending: Internal Medicine | Admitting: Internal Medicine

## 2021-09-06 DIAGNOSIS — Z87891 Personal history of nicotine dependence: Secondary | ICD-10-CM | POA: Diagnosis present

## 2021-09-06 DIAGNOSIS — F17210 Nicotine dependence, cigarettes, uncomplicated: Secondary | ICD-10-CM | POA: Insufficient documentation

## 2021-09-06 DIAGNOSIS — Z122 Encounter for screening for malignant neoplasm of respiratory organs: Secondary | ICD-10-CM | POA: Insufficient documentation

## 2021-09-08 ENCOUNTER — Other Ambulatory Visit: Payer: Self-pay

## 2021-09-08 DIAGNOSIS — Z122 Encounter for screening for malignant neoplasm of respiratory organs: Secondary | ICD-10-CM

## 2021-09-08 DIAGNOSIS — Z87891 Personal history of nicotine dependence: Secondary | ICD-10-CM

## 2021-09-08 DIAGNOSIS — F17210 Nicotine dependence, cigarettes, uncomplicated: Secondary | ICD-10-CM

## 2021-10-18 ENCOUNTER — Ambulatory Visit: Payer: 59 | Admitting: Internal Medicine

## 2021-12-13 ENCOUNTER — Encounter: Payer: Self-pay | Admitting: Internal Medicine

## 2021-12-13 ENCOUNTER — Encounter: Payer: Self-pay | Admitting: *Deleted

## 2021-12-13 ENCOUNTER — Ambulatory Visit: Payer: 59 | Admitting: Internal Medicine

## 2021-12-13 VITALS — BP 126/84 | HR 75 | Ht 67.0 in | Wt 217.3 lb

## 2021-12-13 DIAGNOSIS — G4733 Obstructive sleep apnea (adult) (pediatric): Secondary | ICD-10-CM | POA: Diagnosis not present

## 2021-12-13 DIAGNOSIS — F17200 Nicotine dependence, unspecified, uncomplicated: Secondary | ICD-10-CM

## 2021-12-13 DIAGNOSIS — I2510 Atherosclerotic heart disease of native coronary artery without angina pectoris: Secondary | ICD-10-CM | POA: Diagnosis not present

## 2021-12-13 DIAGNOSIS — Z024 Encounter for examination for driving license: Secondary | ICD-10-CM

## 2021-12-13 DIAGNOSIS — I50810 Right heart failure, unspecified: Secondary | ICD-10-CM

## 2021-12-13 NOTE — Assessment & Plan Note (Signed)
-   I instructed the patient to stop smoking and provided them with smoking cessation materials.  - I informed the patient that smoking puts them at increased risk for cancer, COPD, hypertension, and more.  - Informed the patient to seek help if they begin to have trouble breathing, develop chest pain, start to cough up blood, feel faint, or pass out.  

## 2021-12-13 NOTE — Assessment & Plan Note (Signed)
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

## 2021-12-13 NOTE — Assessment & Plan Note (Signed)
Patient denies any history of angina atypical chest pain or shortness of breath.  There is no palpitation.  No passing out spell.  Heart is regular chest is clear.  Electrocardiogram does not show any acute changes.

## 2021-12-13 NOTE — Assessment & Plan Note (Signed)
Patient has lost 100 pounds

## 2021-12-13 NOTE — Progress Notes (Signed)
Established Patient Office Visit  Subjective:  Patient ID: Raymond Humphrey, male    DOB: 1960-11-17  Age: 61 y.o. MRN: [id number]  CC:  Chief Complaint  Patient presents with   Follow-up    HPI  Saiquan Hands presents for check up/ pt lost 100 pounds , denies chest pain , pt is truck driver  Past Medical History:  Diagnosis Date   Coronary artery disease    CAD: MI in 1/09 in [place].  Had BMS x 2 to the RCA at that time.  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  Nuclear Stress Test (01/2012):  diaph atten, no ischemia, EF 56%.      Hx of cardiovascular stress test    GXT (2/16): no ischemic EKG changes   Hyperlipidemia    Hypertension    Mild pulmonic regurgitation and RV dilation by prior echocardiogram    Dilated RV on 2009 echo.  However, echo (10/12) showed EF 55%, basal to mid inferior hypokinesis, grade II diastolic dysfunction, normal RV size and systolic function, PA systolic pressure;  Echocardiogram (01/28/11):  EF 55%, inf mild HK, Gr 2 DD, mild LAE, mild RAE   Obesity    OSA (obstructive sleep apnea)    on CPAP   Polycythemia    Smoking     Past Surgical History:  Procedure Laterality Date   APPENDECTOMY     PERCUTANEOUS CORONARY STENT INTERVENTION (PCI-S)  2009   BMS x 2 to RCA - hospital in [place]     Family History  Problem Relation Age of Onset   Heart attack Father    Heart attack Cousin    Heart attack Maternal Grandfather     Social History   Socioeconomic History   Marital status: Married    Spouse name: Not on file   Number of children: Not on file   Years of education: Not on file   Highest education level: Not on file  Occupational History   Occupation: Truck Education administrator: [employer]  Tobacco Use   Smoking status: Every Day    Packs/day: 0.50    Years: 35.00    Total pack years: 17.50    Types: Cigarettes   Smokeless tobacco: Never  Vaping Use    Vaping Use: Never used  Substance and Sexual Activity   Alcohol use: Yes    Comment: 6pack a week when home   Drug use: No   Sexual activity: Yes  Other Topics Concern   Not on file  Social History Narrative   Not on file   Social Determinants of Health   Financial Resource Strain: Not on file  Food Insecurity: Not on file  Transportation Needs: Not on file  Physical Activity: Not on file  Stress: Not on file  Social Connections: Not on file  Intimate Partner Violence: Not on file     Current Outpatient Medications:    aspirin EC 81 MG tablet, Take 1 tablet (81 mg total) by mouth daily., Disp: , Rfl:    atorvastatin (LIPITOR) 40 MG tablet, Take 1 tablet (40 mg total) by mouth daily., Disp: 30 tablet, Rfl: 11   lisinopril (ZESTRIL) 20 MG tablet, Take 1 tablet (20 mg total) by mouth daily., Disp: 90 tablet, Rfl: 3   sildenafil (VIAGRA) 50 MG tablet, TAKE 1 TABLET BY MOUTH AS NEEDED FOR ERECTILE DYSFUNCTION, Disp: 30 tablet, Rfl: 6   No Known  Allergies  ROS Review of Systems  Constitutional: Negative.   HENT: Negative.    Eyes: Negative.   Respiratory: Negative.    Cardiovascular: Negative.   Gastrointestinal: Negative.   Endocrine: Negative.   Genitourinary: Negative.   Musculoskeletal: Negative.   Skin: Negative.   Allergic/Immunologic: Negative.   Neurological: Negative.   Hematological: Negative.   Psychiatric/Behavioral: Negative.    All other systems reviewed and are negative.     Objective:    Physical Exam Vitals reviewed.  Constitutional:      Appearance: Normal appearance.  HENT:     Mouth/Throat:     Mouth: Mucous membranes are moist.  Eyes:     Pupils: Pupils are equal, round, and reactive to light.  Neck:     Vascular: No carotid bruit.  Cardiovascular:     Rate and Rhythm: Normal rate and regular rhythm.     Pulses: Normal pulses.     Heart sounds: Normal heart sounds.  Pulmonary:     Effort: Pulmonary effort is normal.     Breath  sounds: Normal breath sounds.  Abdominal:     General: Bowel sounds are normal.     Palpations: Abdomen is soft. There is no hepatomegaly, splenomegaly or mass.     Tenderness: There is no abdominal tenderness.     Hernia: No hernia is present.  Musculoskeletal:     Cervical back: Neck supple.     Right lower leg: No edema.     Left lower leg: No edema.  Skin:    Findings: No rash.  Neurological:     Mental Status: He is alert and oriented to person, place, and time.     Motor: No weakness.  Psychiatric:        Mood and Affect: Mood normal.        Behavior: Behavior normal.     BP 126/84   Pulse 75   Ht '5\' 7"'$  (1.702 m)   Wt 217 lb 4.8 oz (98.6 kg)   BMI 34.03 kg/m  Wt Readings from Last 3 Encounters:  12/13/21 217 lb 4.8 oz (98.6 kg)  09/06/21 226 lb (102.5 kg)  02/15/21 286 lb 8 oz (130 kg)     Health Maintenance Due  Topic Date Due   COVID-19 Vaccine (1) Never done   HIV Screening  Never done   Hepatitis C Screening  Never done   TETANUS/TDAP  Never done   COLONOSCOPY (Pts 45-80yr Insurance coverage will need to be confirmed)  Never done   Zoster Vaccines- Shingrix (1 of 2) Never done   INFLUENZA VACCINE  11/23/2021    There are no preventive care reminders to display for this patient.  No results found for: "TSH" Lab Results  Component Value Date   WBC 9.0 01/08/2021   HGB 16.4 01/08/2021   HCT 48.9 01/08/2021   MCV 92.4 01/08/2021   PLT 193 01/08/2021   Lab Results  Component Value Date   NA 136 02/23/2016   K 4.4 02/23/2016   CO2 28 02/23/2016   GLUCOSE 107 (H) 02/23/2016   BUN 20 02/23/2016   CREATININE 1.36 (H) 02/23/2016   BILITOT 0.3 02/23/2016   ALKPHOS 81 02/23/2016   AST 31 02/23/2016   ALT 41 02/23/2016   PROT 7.6 02/23/2016   ALBUMIN 4.3 02/23/2016   CALCIUM 8.6 (L) 02/23/2016   ANIONGAP 5 02/23/2016   GFR 80.30 06/23/2014   Lab Results  Component Value Date   CHOL 183 06/23/2014   Lab  Results  Component Value Date   HDL  44.70 06/23/2014   Lab Results  Component Value Date   LDLCALC 113 (H) 06/23/2014   Lab Results  Component Value Date   TRIG 125.0 06/23/2014   Lab Results  Component Value Date   CHOLHDL 4 06/23/2014   No results found for: "HGBA1C"    Assessment & Plan:   Problem List Items Addressed This Visit       Cardiovascular and Mediastinum   Coronary artery disease, non-occlusive    Patient denies any history of angina atypical chest pain or shortness of breath.  There is no palpitation.  No passing out spell.  Heart is regular chest is clear.  Electrocardiogram does not show any acute changes.      RVF (right ventricular failure) ([place]) - Primary   Relevant Orders   EKG 12-Lead     Respiratory   OSA (obstructive sleep apnea)    Patient is using CPAP machine        Other   Smoking    - I instructed the patient to stop smoking and provided them with smoking cessation materials.  - I informed the patient that smoking puts them at increased risk for cancer, COPD, hypertension, and more.  - Informed the patient to seek help if they begin to have trouble breathing, develop chest pain, start to cough up blood, feel faint, or pass out.      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 Normal sinus rhythm no acute changes are noted.  No orders of the defined types were placed in this encounter.   Follow-up: No follow-ups on file.    Cletis Athens, MD

## 2021-12-13 NOTE — Assessment & Plan Note (Signed)
Patient is using CPAP machine

## 2021-12-22 ENCOUNTER — Ambulatory Visit: Payer: 59 | Admitting: Internal Medicine

## 2021-12-29 ENCOUNTER — Ambulatory Visit: Payer: 59 | Admitting: Internal Medicine

## 2022-05-27 DIAGNOSIS — Z8616 Personal history of COVID-19: Secondary | ICD-10-CM

## 2022-05-27 HISTORY — DX: Personal history of COVID-19: Z86.16

## 2022-08-19 ENCOUNTER — Other Ambulatory Visit: Payer: Self-pay | Admitting: Orthopedic Surgery

## 2022-08-23 ENCOUNTER — Other Ambulatory Visit: Payer: Self-pay

## 2022-08-23 ENCOUNTER — Encounter
Admission: RE | Admit: 2022-08-23 | Discharge: 2022-08-23 | Disposition: A | Discharge status: Home / Self Care | Payer: 59 | Source: Ambulatory Visit | Attending: Orthopedic Surgery | Admitting: Orthopedic Surgery

## 2022-08-23 ENCOUNTER — Encounter: Payer: Self-pay | Admitting: Orthopedic Surgery

## 2022-08-23 DIAGNOSIS — I2510 Atherosclerotic heart disease of native coronary artery without angina pectoris: Secondary | ICD-10-CM

## 2022-08-23 DIAGNOSIS — I50810 Right heart failure, unspecified: Secondary | ICD-10-CM

## 2022-08-23 DIAGNOSIS — D751 Secondary polycythemia: Secondary | ICD-10-CM

## 2022-08-23 DIAGNOSIS — Z01812 Encounter for preprocedural laboratory examination: Secondary | ICD-10-CM

## 2022-08-23 HISTORY — DX: Personal history of urinary calculi: Z87.442

## 2022-08-23 HISTORY — DX: Acute myocardial infarction, unspecified: I21.9

## 2022-08-23 NOTE — Patient Instructions (Addendum)
Your procedure is scheduled on: 09/01/22 - Thursday Report to the Registration Desk on the 1st floor of the [facility]. To find out your arrival time, please call [phone] between 1PM - 3PM on: 08/31/22 - Wednesday If your arrival time is 6:00 am, do not arrive before that time as the [facility] entrance doors do not open until 6:00 am.  REMEMBER: Instructions that are not followed completely may result in serious medical risk, up to and including death; or upon the discretion of your surgeon and anesthesiologist your surgery may need to be rescheduled.  Do not eat food or drink any liquids after midnight the night before surgery.  No gum chewing or hard candies.  One week prior to surgery: Stop Anti-inflammatories (NSAIDS) such as Advil, Aleve, Ibuprofen, Motrin, Naproxen, Naprosyn and Aspirin based products such as Excedrin, Goody's Powder, BC Powder.  Stop ANY OVER THE COUNTER supplements until after surgery.  You may however, continue to take Tylenol if needed for pain up until the day of surgery.  Continue taking all prescribed medications with the exception of the following:  sildenafil (VIAGRA) - hold beginning 08/30/22.  TAKE ONLY THESE MEDICATIONS THE MORNING OF SURGERY WITH A SIP OF WATER:  NONE   No Alcohol for 24 hours before or after surgery.  No Smoking including e-cigarettes for 24 hours before surgery.  No chewable tobacco products for at least 6 hours before surgery.  No nicotine patches on the day of surgery.  Do not use any "recreational" drugs for at least a week (preferably 2 weeks) before your surgery.  Please be advised that the combination of cocaine and anesthesia may have negative outcomes, up to and including death. If you test positive for cocaine, your surgery will be cancelled.  On the morning of surgery brush your teeth with toothpaste and water, you may rinse your mouth with mouthwash if you wish. Do not swallow any toothpaste or  mouthwash.  Use CHG Soap or wipes as directed on instruction sheet.  Do not wear jewelry, make-up, hairpins, clips or nail polish.  Do not wear lotions, powders, or perfumes.   Do not shave body hair from the neck down 48 hours before surgery.  Contact lenses, hearing aids and dentures may not be worn into surgery.  Do not bring valuables to the hospital. [facility] is not responsible for any missing/lost belongings or valuables.   Bring your C-PAP to the hospital in case you may have to spend the night.   Notify your doctor if there is any change in your medical condition (cold, fever, infection).  Wear comfortable clothing (specific to your surgery type) to the hospital.  After surgery, you can help prevent lung complications by doing breathing exercises.  Take deep breaths and cough every 1-2 hours. Your doctor may order a device called an Incentive Spirometer to help you take deep breaths. When coughing or sneezing, hold a pillow firmly against your incision with both hands. This is called "splinting." Doing this helps protect your incision. It also decreases belly discomfort.  If you are being admitted to the hospital overnight, leave your suitcase in the car. After surgery it may be brought to your room.  In case of increased patient census, it may be necessary for you, the patient, to continue your postoperative care in the Same Day Surgery department.  If you are being discharged the day of surgery, you will not be allowed to drive home. You will need a responsible individual to  drive you home and stay with you for 24 hours after surgery.   If you are taking public transportation, you will need to have a responsible individual with you.  Please call the Pre-admissions Testing Dept. at [phone] if you have any questions about these instructions.  Surgery Visitation Policy:  Patients having surgery or a procedure may have two visitors.  Children under the age of 95  must have an adult with them who is not the patient.  Inpatient Visitation:    Visiting hours are 7 a.m. to 8 p.m. Up to four visitors are allowed at one time in a patient room. The visitors may rotate out with other people during the day.  One visitor age 79 or older may stay with the patient overnight and must be in the room by 8 p.m.

## 2022-08-24 ENCOUNTER — Encounter
Admission: RE | Admit: 2022-08-24 | Discharge: 2022-08-24 | Disposition: A | Discharge status: Home / Self Care | Payer: 59 | Source: Ambulatory Visit | Attending: Orthopedic Surgery | Admitting: Orthopedic Surgery

## 2022-08-24 DIAGNOSIS — I50810 Right heart failure, unspecified: Secondary | ICD-10-CM | POA: Insufficient documentation

## 2022-08-24 DIAGNOSIS — Z01818 Encounter for other preprocedural examination: Secondary | ICD-10-CM | POA: Insufficient documentation

## 2022-08-24 DIAGNOSIS — Z01812 Encounter for preprocedural laboratory examination: Secondary | ICD-10-CM

## 2022-08-24 DIAGNOSIS — I2510 Atherosclerotic heart disease of native coronary artery without angina pectoris: Secondary | ICD-10-CM

## 2022-08-24 DIAGNOSIS — D751 Secondary polycythemia: Secondary | ICD-10-CM | POA: Diagnosis not present

## 2022-08-24 LAB — CBC
HCT: 50.5 % (ref 39.0–52.0)
Hemoglobin: 17.3 g/dL — ABNORMAL HIGH (ref 13.0–17.0)
MCH: 30.9 pg (ref 26.0–34.0)
MCHC: 34.3 g/dL (ref 30.0–36.0)
MCV: 90.3 fL (ref 80.0–100.0)
Platelets: 142 10*3/uL — ABNORMAL LOW (ref 150–400)
RBC: 5.59 MIL/uL (ref 4.22–5.81)
RDW: 12.1 % (ref 11.5–15.5)
WBC: 6.8 10*3/uL (ref 4.0–10.5)
nRBC: 0 % (ref 0.0–0.2)

## 2022-08-24 LAB — COMPREHENSIVE METABOLIC PANEL
ALT: 15 U/L (ref 0–44)
AST: 20 U/L (ref 15–41)
Albumin: 4 g/dL (ref 3.5–5.0)
Alkaline Phosphatase: 82 U/L (ref 38–126)
Anion gap: 6 (ref 5–15)
BUN: 21 mg/dL (ref 8–23)
CO2: 25 mmol/L (ref 22–32)
Calcium: 8.9 mg/dL (ref 8.9–10.3)
Chloride: 105 mmol/L (ref 98–111)
Creatinine, Ser: 0.97 mg/dL (ref 0.61–1.24)
GFR, Estimated: >60 mL/min (ref >60)
Glucose, Bld: 107 mg/dL — ABNORMAL HIGH (ref 70–99)
Potassium: 3.9 mmol/L (ref 3.5–5.1)
Sodium: 136 mmol/L (ref 135–145)
Total Bilirubin: 0.7 mg/dL (ref 0.3–1.2)
Total Protein: 6.8 g/dL (ref 6.5–8.1)

## 2022-08-30 ENCOUNTER — Encounter: Payer: Self-pay | Admitting: Urgent Care

## 2022-09-01 ENCOUNTER — Other Ambulatory Visit: Payer: Self-pay

## 2022-09-01 ENCOUNTER — Ambulatory Visit
Admission: RE | Admit: 2022-09-01 | Discharge: 2022-09-01 | Disposition: A | Discharge status: Home / Self Care | Payer: 59 | Attending: Orthopedic Surgery | Admitting: Orthopedic Surgery

## 2022-09-01 ENCOUNTER — Ambulatory Visit: Payer: 59 | Admitting: Urgent Care

## 2022-09-01 ENCOUNTER — Encounter
Admission: RE | Disposition: A | Discharge status: Home / Self Care | Payer: Self-pay | Source: Home / Self Care | Attending: Orthopedic Surgery

## 2022-09-01 ENCOUNTER — Ambulatory Visit: Payer: 59

## 2022-09-01 ENCOUNTER — Encounter: Payer: Self-pay | Admitting: Orthopedic Surgery

## 2022-09-01 DIAGNOSIS — M25811 Other specified joint disorders, right shoulder: Secondary | ICD-10-CM | POA: Diagnosis not present

## 2022-09-01 DIAGNOSIS — I252 Old myocardial infarction: Secondary | ICD-10-CM | POA: Insufficient documentation

## 2022-09-01 DIAGNOSIS — I2510 Atherosclerotic heart disease of native coronary artery without angina pectoris: Secondary | ICD-10-CM | POA: Diagnosis not present

## 2022-09-01 DIAGNOSIS — I10 Essential (primary) hypertension: Secondary | ICD-10-CM | POA: Insufficient documentation

## 2022-09-01 DIAGNOSIS — S43431A Superior glenoid labrum lesion of right shoulder, initial encounter: Secondary | ICD-10-CM | POA: Diagnosis not present

## 2022-09-01 DIAGNOSIS — Z955 Presence of coronary angioplasty implant and graft: Secondary | ICD-10-CM | POA: Insufficient documentation

## 2022-09-01 DIAGNOSIS — G4733 Obstructive sleep apnea (adult) (pediatric): Secondary | ICD-10-CM | POA: Insufficient documentation

## 2022-09-01 DIAGNOSIS — F17210 Nicotine dependence, cigarettes, uncomplicated: Secondary | ICD-10-CM | POA: Insufficient documentation

## 2022-09-01 DIAGNOSIS — X58XXXA Exposure to other specified factors, initial encounter: Secondary | ICD-10-CM | POA: Insufficient documentation

## 2022-09-01 DIAGNOSIS — Z8249 Family history of ischemic heart disease and other diseases of the circulatory system: Secondary | ICD-10-CM | POA: Diagnosis not present

## 2022-09-01 DIAGNOSIS — M75121 Complete rotator cuff tear or rupture of right shoulder, not specified as traumatic: Secondary | ICD-10-CM | POA: Diagnosis present

## 2022-09-01 DIAGNOSIS — M19011 Primary osteoarthritis, right shoulder: Secondary | ICD-10-CM | POA: Insufficient documentation

## 2022-09-01 HISTORY — DX: Obstructive sleep apnea (adult) (pediatric): G47.33

## 2022-09-01 HISTORY — PX: SHOULDER ARTHROSCOPY: SHX128

## 2022-09-01 HISTORY — DX: Male erectile dysfunction, unspecified: N52.9

## 2022-09-01 HISTORY — PX: SHOULDER ARTHROSCOPY WITH OPEN ROTATOR CUFF REPAIR AND DISTAL CLAVICLE ACROMINECTOMY: SHX5683

## 2022-09-01 SURGERY — SHOULDER ARTHROSCOPY WITH OPEN ROTATOR CUFF REPAIR AND DISTAL CLAVICLE ACROMINECTOMY
Anesthesia: General | Site: Shoulder | Laterality: Right

## 2022-09-01 MED ORDER — LIDOCAINE HCL (PF) 1 % IJ SOLN
INTRAMUSCULAR | Status: AC
  Filled 2022-09-01: qty 30

## 2022-09-01 MED ORDER — EPINEPHRINE PF 1 MG/ML IJ SOLN
INTRAMUSCULAR | Status: AC
  Filled 2022-09-01: qty 5

## 2022-09-01 MED ORDER — BUPIVACAINE HCL (PF) 0.5 % IJ SOLN: INTRAMUSCULAR | Status: DC | PRN

## 2022-09-01 MED ORDER — CEFAZOLIN SODIUM-DEXTROSE 2-4 GM/100ML-% IV SOLN
INTRAVENOUS | Status: AC
  Filled 2022-09-01: qty 100

## 2022-09-01 MED ORDER — FAMOTIDINE 20 MG PO TABS
20.0000 mg | ORAL_TABLET | Freq: Once | ORAL | Status: AC
  Administered 2022-09-01: 20 mg via ORAL

## 2022-09-01 MED ORDER — EPHEDRINE SULFATE (PRESSORS) 50 MG/ML IJ SOLN
INTRAMUSCULAR | Status: DC | PRN
  Administered 2022-09-01 (×2): 5 mg via INTRAVENOUS

## 2022-09-01 MED ORDER — CHLORHEXIDINE GLUCONATE 0.12 % MT SOLN
OROMUCOSAL | Status: AC
  Filled 2022-09-01: qty 15

## 2022-09-01 MED ORDER — OXYCODONE HCL 5 MG PO TABS: 5.0000 mg | ORAL_TABLET | ORAL | 0 refills | Status: DC | PRN

## 2022-09-01 MED ORDER — DOCUSATE SODIUM 100 MG PO CAPS: 100.0000 mg | ORAL_CAPSULE | Freq: Two times a day (BID) | ORAL | 1 refills | Status: DC

## 2022-09-01 MED ORDER — PROPOFOL 10 MG/ML IV BOLUS
INTRAVENOUS | Status: AC
  Filled 2022-09-01: qty 20

## 2022-09-01 MED ORDER — BUPIVACAINE LIPOSOME 1.3 % IJ SUSP
INTRAMUSCULAR | Status: DC | PRN
  Administered 2022-09-01: 20 mL via PERINEURAL

## 2022-09-01 MED ORDER — BUPIVACAINE HCL (PF) 0.5 % IJ SOLN
INTRAMUSCULAR | Status: DC | PRN
  Administered 2022-09-01: 10 mL via PERINEURAL

## 2022-09-01 MED ORDER — FENTANYL CITRATE (PF) 100 MCG/2ML IJ SOLN
INTRAMUSCULAR | Status: AC
  Filled 2022-09-01: qty 2

## 2022-09-01 MED ORDER — PHENYLEPHRINE HCL-NACL 20-0.9 MG/250ML-% IV SOLN
INTRAVENOUS | Status: AC
  Filled 2022-09-01: qty 250

## 2022-09-01 MED ORDER — ORAL CARE MOUTH RINSE: 15.0000 mL | Freq: Once | OROMUCOSAL | Status: AC

## 2022-09-01 MED ORDER — ONDANSETRON HCL 4 MG/2ML IJ SOLN
INTRAMUSCULAR | Status: DC | PRN
  Administered 2022-09-01: 4 mg via INTRAVENOUS

## 2022-09-01 MED ORDER — SUGAMMADEX SODIUM 200 MG/2ML IV SOLN
INTRAVENOUS | Status: DC | PRN
  Administered 2022-09-01: 200 mg via INTRAVENOUS

## 2022-09-01 MED ORDER — GLYCOPYRROLATE 0.2 MG/ML IJ SOLN
INTRAMUSCULAR | Status: DC | PRN
  Administered 2022-09-01: .2 mg via INTRAVENOUS

## 2022-09-01 MED ORDER — ONDANSETRON HCL 4 MG PO TABS: 4.0000 mg | ORAL_TABLET | Freq: Three times a day (TID) | ORAL | 0 refills | Status: DC | PRN

## 2022-09-01 MED ORDER — LACTATED RINGERS IR SOLN
Status: DC | PRN
  Administered 2022-09-01: 4 mL

## 2022-09-01 MED ORDER — BISACODYL 5 MG PO TBEC: 5.0000 mg | DELAYED_RELEASE_TABLET | Freq: Every day | ORAL | 1 refills | Status: AC | PRN

## 2022-09-01 MED ORDER — BUPIVACAINE LIPOSOME 1.3 % IJ SUSP
INTRAMUSCULAR | Status: AC
  Filled 2022-09-01: qty 20

## 2022-09-01 MED ORDER — CHLORHEXIDINE GLUCONATE CLOTH 2 % EX PADS: 6.0000 | MEDICATED_PAD | Freq: Once | CUTANEOUS | Status: DC

## 2022-09-01 MED ORDER — PROPOFOL 10 MG/ML IV BOLUS
INTRAVENOUS | Status: DC | PRN
  Administered 2022-09-01: 130 mg via INTRAVENOUS
  Administered 2022-09-01: 40 mg via INTRAVENOUS

## 2022-09-01 MED ORDER — ROCURONIUM BROMIDE 100 MG/10ML IV SOLN
INTRAVENOUS | Status: DC | PRN
  Administered 2022-09-01: 20 mg via INTRAVENOUS
  Administered 2022-09-01: 50 mg via INTRAVENOUS
  Administered 2022-09-01: 20 mg via INTRAVENOUS

## 2022-09-01 MED ORDER — DEXAMETHASONE SODIUM PHOSPHATE 10 MG/ML IJ SOLN
INTRAMUSCULAR | Status: DC | PRN
  Administered 2022-09-01: 8 mg via INTRAVENOUS

## 2022-09-01 MED ORDER — FENTANYL CITRATE PF 50 MCG/ML IJ SOSY
PREFILLED_SYRINGE | INTRAMUSCULAR | Status: AC
  Filled 2022-09-01: qty 1

## 2022-09-01 MED ORDER — CHLORHEXIDINE GLUCONATE 0.12 % MT SOLN
15.0000 mL | Freq: Once | OROMUCOSAL | Status: AC
  Administered 2022-09-01: 15 mL via OROMUCOSAL

## 2022-09-01 MED ORDER — BUPIVACAINE HCL (PF) 0.5 % IJ SOLN
INTRAMUSCULAR | Status: AC
  Filled 2022-09-01: qty 10

## 2022-09-01 MED ORDER — DROPERIDOL 2.5 MG/ML IJ SOLN: 0.6250 mg | Freq: Once | INTRAMUSCULAR | Status: DC | PRN

## 2022-09-01 MED ORDER — CEFAZOLIN SODIUM-DEXTROSE 2-4 GM/100ML-% IV SOLN
2.0000 g | INTRAVENOUS | Status: AC
  Administered 2022-09-01: 2 mg via INTRAVENOUS

## 2022-09-01 MED ORDER — ACETAMINOPHEN 500 MG PO TABS
1000.0000 mg | ORAL_TABLET | ORAL | Status: AC
  Administered 2022-09-01: 1000 mg via ORAL

## 2022-09-01 MED ORDER — LIDOCAINE HCL (PF) 1 % IJ SOLN
INTRAMUSCULAR | Status: DC | PRN
  Administered 2022-09-01: 4 mL via SUBCUTANEOUS

## 2022-09-01 MED ORDER — LACTATED RINGERS IV SOLN: INTRAVENOUS | Status: DC

## 2022-09-01 MED ORDER — PHENYLEPHRINE HCL-NACL 20-0.9 MG/250ML-% IV SOLN
INTRAVENOUS | Status: DC | PRN
  Administered 2022-09-01: 50 ug/min via INTRAVENOUS

## 2022-09-01 MED ORDER — FENTANYL CITRATE (PF) 100 MCG/2ML IJ SOLN: 25.0000 ug | INTRAMUSCULAR | Status: DC | PRN

## 2022-09-01 MED ORDER — LIDOCAINE HCL (PF) 1 % IJ SOLN
INTRAMUSCULAR | Status: AC
  Filled 2022-09-01: qty 5

## 2022-09-01 MED ORDER — FAMOTIDINE 20 MG PO TABS
ORAL_TABLET | ORAL | Status: AC
  Filled 2022-09-01: qty 1

## 2022-09-01 MED ORDER — MIDAZOLAM HCL 2 MG/2ML IJ SOLN
1.0000 mg | INTRAMUSCULAR | Status: DC | PRN
  Administered 2022-09-01: 1 mg via INTRAVENOUS

## 2022-09-01 MED ORDER — BUPIVACAINE LIPOSOME 1.3 % IJ SUSP: INTRAMUSCULAR | Status: DC | PRN

## 2022-09-01 MED ORDER — BUPIVACAINE HCL (PF) 0.25 % IJ SOLN
INTRAMUSCULAR | Status: AC
  Filled 2022-09-01: qty 30

## 2022-09-01 MED ORDER — MIDAZOLAM HCL 2 MG/2ML IJ SOLN
INTRAMUSCULAR | Status: AC
  Filled 2022-09-01: qty 2

## 2022-09-01 MED ORDER — FENTANYL CITRATE PF 50 MCG/ML IJ SOSY
50.0000 ug | PREFILLED_SYRINGE | Freq: Once | INTRAMUSCULAR | Status: AC
  Administered 2022-09-01: 50 ug via INTRAVENOUS

## 2022-09-01 MED ORDER — MIDAZOLAM HCL 2 MG/2ML IJ SOLN
INTRAMUSCULAR | Status: DC | PRN
  Administered 2022-09-01 (×2): 1 mg via INTRAVENOUS

## 2022-09-01 MED ORDER — ACETAMINOPHEN 500 MG PO TABS
ORAL_TABLET | ORAL | Status: AC
  Filled 2022-09-01: qty 2

## 2022-09-01 SURGICAL SUPPLY — 71 items
ADAPTER IRRIG TUBE 2 SPIKE SOL (ADAPTER) ×4 IMPLANT
ADPR TBG 2 SPK PMP STRL ASCP (ADAPTER) ×4
ANCH SUT 2 SUTTK 14.5X3 (Anchor) ×4 IMPLANT
ANCH SUT 5.5 KNTLS PEEK (Orthopedic Implant) ×6 IMPLANT
ANCHOR QFIX 2.8 SUT MINI TAPE (Anchor) IMPLANT
ANCHOR SUT 5.5 MULTIFIX (Orthopedic Implant) IMPLANT
ANCHOR SUT BIOC ST 3X145 (Anchor) IMPLANT
CANNULA 5.75X7 CRYSTAL CLEAR (CANNULA) ×2 IMPLANT
CANNULA PARTIAL THREAD 2X7 (CANNULA) IMPLANT
CANNULA TWIST IN 8.25X9CM (CANNULA) IMPLANT
CONNECTOR PERFECT PASSER (CONNECTOR) ×2 IMPLANT
COOLER POLAR GLACIER W/PUMP (MISCELLANEOUS) ×2 IMPLANT
DEVICE SUCT BLK HOLE OR FLOOR (MISCELLANEOUS) ×4 IMPLANT
DRAPE 3/4 80X56 (DRAPES) ×2 IMPLANT
DRAPE U-SHAPE 47X51 STRL (DRAPES) ×2 IMPLANT
DURAPREP 26ML APPLICATOR (WOUND CARE) ×6 IMPLANT
ELECT REM PT RETURN 9FT ADLT (ELECTROSURGICAL) ×2
ELECTRODE REM PT RTRN 9FT ADLT (ELECTROSURGICAL) ×2 IMPLANT
GAUZE SPONGE 4X4 12PLY STRL (GAUZE/BANDAGES/DRESSINGS) ×2 IMPLANT
GAUZE XEROFORM 1X8 LF (GAUZE/BANDAGES/DRESSINGS) ×2 IMPLANT
GLOVE BIOGEL PI IND STRL 9 (GLOVE) ×2 IMPLANT
GLOVE BIOGEL PI ORTHO SZ9 (GLOVE) ×12 IMPLANT
GOWN STRL REUS TWL 2XL XL LVL4 (GOWN DISPOSABLE) ×2 IMPLANT
GOWN STRL REUS W/ TWL LRG LVL3 (GOWN DISPOSABLE) ×2 IMPLANT
GOWN STRL REUS W/TWL LRG LVL3 (GOWN DISPOSABLE) ×2
IV LACTATED RINGER IRRG 3000ML (IV SOLUTION) ×14
IV LR IRRIG 3000ML ARTHROMATIC (IV SOLUTION) ×16 IMPLANT
KIT STABILIZATION SHOULDER (MISCELLANEOUS) ×2 IMPLANT
KIT SUTURE 2.8 Q-FIX DISP (MISCELLANEOUS) ×2 IMPLANT
KIT SUTURETAK 3.0 INSERT PERC (KITS) IMPLANT
KIT TURNOVER KIT A (KITS) ×2 IMPLANT
MANIFOLD NEPTUNE II (INSTRUMENTS) ×2 IMPLANT
MASK FACE SPIDER DISP (MASK) ×2 IMPLANT
MAT ABSORB  FLUID 56X50 GRAY (MISCELLANEOUS) ×4
MAT ABSORB FLUID 56X50 GRAY (MISCELLANEOUS) ×4 IMPLANT
NDL HYPO 22X1.5 SAFETY MO (MISCELLANEOUS) ×2 IMPLANT
NDL SAFETY ECLIP 18X1.5 (MISCELLANEOUS) ×2 IMPLANT
NEEDLE HYPO 22X1.5 SAFETY MO (MISCELLANEOUS) ×2 IMPLANT
PACK ARTHROSCOPY SHOULDER (MISCELLANEOUS) ×2 IMPLANT
PAD ABD DERMACEA PRESS 5X9 (GAUZE/BANDAGES/DRESSINGS) ×2 IMPLANT
PAD ARMBOARD 7.5X6 YLW CONV (MISCELLANEOUS) ×4 IMPLANT
PAD WRAPON POLAR SHDR XLG (MISCELLANEOUS) ×2 IMPLANT
PASSER SUT FIRSTPASS SELF (INSTRUMENTS) ×2 IMPLANT
SHAVER BLADE BONE CUTTER 4.5 (BLADE) ×2 IMPLANT
SHAVER BLADE TAPERED BLUNT 4 (BLADE) ×2 IMPLANT
SLEEVE REMOTE CONTROL 5X12 (DRAPES) ×2 IMPLANT
SPONGE T-LAP 18X18 ~~LOC~~+RFID (SPONGE) ×2 IMPLANT
STRIP CLOSURE SKIN 1/2X4 (GAUZE/BANDAGES/DRESSINGS) ×2 IMPLANT
SUT ETHILON 4-0 (SUTURE) ×2
SUT ETHILON 4-0 FS2 18XMFL BLK (SUTURE) ×2
SUT LASSO 90 DEG SD STR (SUTURE) IMPLANT
SUT MNCRL 4-0 (SUTURE) ×2
SUT MNCRL 4-0 27XMFL (SUTURE) ×2
SUT ORTHOCORD W/MULTIPK NDL (SUTURE) IMPLANT
SUT PDS AB 0 CT1 27 (SUTURE) ×6 IMPLANT
SUT PERFECTPASSER WHITE CART (SUTURE) ×8 IMPLANT
SUT SMART STITCH CARTRIDGE (SUTURE) ×8 IMPLANT
SUT ULTRABRAID 2 COBRAID 38 (SUTURE) IMPLANT
SUT VIC AB 0 CT1 36 (SUTURE) ×6 IMPLANT
SUT VIC AB 2-0 CT2 27 (SUTURE) ×2 IMPLANT
SUTURE ETHLN 4-0 FS2 18XMF BLK (SUTURE) ×2 IMPLANT
SUTURE MNCRL 4-0 27XMF (SUTURE) ×2 IMPLANT
SYR 10ML LL (SYRINGE) ×2 IMPLANT
TAPE MICROFOAM 4IN (TAPE) ×2 IMPLANT
TRAP FLUID SMOKE EVACUATOR (MISCELLANEOUS) ×2 IMPLANT
TUBE SET DOUBLEFLO INFLOW (TUBING) ×2 IMPLANT
TUBE SET DOUBLEFLO OUTFLOW (TUBING) ×2 IMPLANT
TUBING CONNECTING 10 (TUBING) ×2 IMPLANT
WAND WEREWOLF FLOW 90D (MISCELLANEOUS) ×2 IMPLANT
WATER STERILE IRR 500ML POUR (IV SOLUTION) ×2 IMPLANT
WRAPON POLAR PAD SHDR XLG (MISCELLANEOUS) ×2

## 2022-09-01 NOTE — Anesthesia Preprocedure Evaluation (Signed)
Anesthesia Evaluation  Patient identified by MRN, date of birth, ID band Patient awake    Reviewed: Allergy & Precautions, H&P , NPO status , Patient's Chart, lab work & pertinent test results, reviewed documented beta blocker date and time   History of Anesthesia Complications Negative for: history of anesthetic complications  Airway Mallampati: I  TM Distance: >3 FB Neck ROM: full    Dental  (+) Dental Advidsory Given, Upper Dentures, Lower Dentures, Edentulous Upper, Edentulous Lower   Pulmonary neg shortness of breath, sleep apnea and Continuous Positive Airway Pressure Ventilation , COPD, neg recent URI, Current Smoker and Patient abstained from smoking.   Pulmonary exam normal breath sounds clear to auscultation       Cardiovascular Exercise Tolerance: Good hypertension, (-) angina + CAD, + Past MI and + Cardiac Stents  Normal cardiovascular exam(-) dysrhythmias (-) Valvular Problems/Murmurs Rhythm:regular Rate:Normal     Neuro/Psych negative neurological ROS  negative psych ROS   GI/Hepatic negative GI ROS, Neg liver ROS,,,  Endo/Other  negative endocrine ROS    Renal/GU negative Renal ROS  negative genitourinary   Musculoskeletal   Abdominal   Peds  Hematology negative hematology ROS (+)   Anesthesia Other Findings Past Medical History: 05/09/2007: Coronary artery disease     Comment:  a.) MI 05/09/2007 in [place] --> 4.5 x 12 mm (pRCA) and 3.0 x              16 mm (dRCA) BMS x 2 placed ; b.) Stress myoview in 12/11              in [place] (done for DOT physical) and per the patient               was unremarkable; c.) Nuclear Stress Test (01/2012):                diaph atten, no ischemia, EF 56%. No date: Erectile dysfunction     Comment:  a.) on PDE5i (sildenafil) 05/27/2022: History of 2019 novel coronavirus disease (COVID-19) No date: History of kidney stones No date: Hx of cardiovascular stress test      Comment:  GXT (2/16): no ischemic EKG changes No date: Hyperlipidemia No date: Hypertension No date: Mild pulmonic regurgitation and RV dilation by prior  echocardiogram     Comment:  Dilated RV on 2009 echo. However, echo (10/12) showed               EF 55%, basal to mid inferior hypokinesis, grade II               diastolic dysfunction, normal RV size and systolic               function, PA systolic pressure;  Echocardiogram               (01/28/11):  EF 55%, inf mild HK, Gr 2 DD, mild LAE, mild               RAE 05/09/2007: NSTEMI (non-ST elevated myocardial infarction) ([facility])     Comment:  a.) Tx'd in [facility] 05/09/2007 --> 30% prox posterolateral               OM, 60% pRCA, occluded dRCA --> PTCA + aspiration               thrombectomy + PCI (4.5 x 12 mm Liberta BMS pRCA and 3.0               x 16  mm Liberta BMS dRCA) No date: Obesity No date: OSA on CPAP No date: Secondary polycythemia     Comment:  a.) smoking + obesity + OSAH; b.) JAK2 + EPO 03/2019               normal --> not polycythemia vera No date: Smoking   Reproductive/Obstetrics negative OB ROS                             Anesthesia Physical Anesthesia Plan  ASA: 2  Anesthesia Plan: General   Post-op Pain Management: Regional block*   Induction: Intravenous  PONV Risk Score and Plan: 1 and Ondansetron, Dexamethasone, Midazolam and Treatment may vary due to age or medical condition  Airway Management Planned: Oral ETT  Additional Equipment:   Intra-op Plan:   Post-operative Plan: Extubation in OR  Informed Consent: I have reviewed the patients History and Physical, chart, labs and discussed the procedure including the risks, benefits and alternatives for the proposed anesthesia with the patient or authorized representative who has indicated his/her understanding and acceptance.     Dental Advisory Given  Plan Discussed with: Anesthesiologist, CRNA and Surgeon  Anesthesia Plan  Comments:        Anesthesia Quick Evaluation

## 2022-09-01 NOTE — Transfer of Care (Signed)
Immediate Anesthesia Transfer of Care Note  Patient: Raymond Humphrey  Procedure(s) Performed: SHOULDER ARTHROSCOPY WITH OPEN ROTATOR CUFF REPAIR AND DISTAL CLAVICLE ACROMINECTOMY (Right) ARTHROSCOPY SHOULDER (Right: Shoulder)  Patient Location: PACU  Anesthesia Type:General  Level of Consciousness: drowsy  Airway & Oxygen Therapy: Patient Spontanous Breathing and Patient connected to face mask oxygen  Post-op Assessment: Report given to RN  Post vital signs: stable  Last Vitals:  Vitals Value Taken Time  BP 108/68 09/01/22 1115  Temp    Pulse 70 09/01/22 1116  Resp 18 09/01/22 1116  SpO2 96 % 09/01/22 1116  Vitals shown include unvalidated device data.  Last Pain:  Vitals:   09/01/22 0649  TempSrc: Temporal  PainSc: 0-No pain         Complications: No notable events documented.

## 2022-09-01 NOTE — Anesthesia Procedure Notes (Signed)
Procedure Name: Intubation Date/Time: 09/01/2022 8:17 AM  Performed by: Jaye Beagle, CRNAPre-anesthesia Checklist: Patient identified, Emergency Drugs available, Suction available and Patient being monitored Patient Re-evaluated:Patient Re-evaluated prior to induction Oxygen Delivery Method: Circle system utilized Preoxygenation: Pre-oxygenation with 100% oxygen Induction Type: IV induction Ventilation: Mask ventilation without difficulty Laryngoscope Size: McGraph and 4 Grade View: Grade I Tube type: Oral Tube size: 7.5 mm Number of attempts: 1 Airway Equipment and Method: Stylet Placement Confirmation: ETT inserted through vocal cords under direct vision, positive ETCO2 and breath sounds checked- equal and bilateral Secured at: 23 cm Tube secured with: Tape Dental Injury: Teeth and Oropharynx as per pre-operative assessment

## 2022-09-01 NOTE — Discharge Instructions (Signed)
AMBULATORY SURGERY  DISCHARGE INSTRUCTIONS   The drugs that you were given will stay in your system until tomorrow so for the next 24 hours you should not:  Drive an automobile Make any legal decisions Drink any alcoholic beverage   You may resume regular meals tomorrow.  Today it is better to start with liquids and gradually work up to solid foods.  You may eat anything you prefer, but it is better to start with liquids, then soup and crackers, and gradually work up to solid foods.   Please notify your doctor immediately if you have any unusual bleeding, trouble breathing, redness and pain at the surgery site, drainage, fever, or pain not relieved by medication.     Additional Instructions:  LEAVE GREEN ARMBAND ON FOR 4 DAYS     Interscalene Nerve Block with Exparel   For your surgery you have received an Interscalene Nerve Block with Exparel. Nerve Blocks affect many types of nerves, including nerves that control movement, pain and normal sensation.  You may experience feelings such as numbness, tingling, heaviness, weakness or the inability to move your arm or the feeling or sensation that your arm has "fallen asleep". A nerve block with Exparel can last up to 5 days.  Usually the weakness wears off first.  The tingling and heaviness usually wear off next.  Finally you may start to notice pain.  Keep in mind that this may occur in any order.  Once a nerve block starts to wear off it is usually completely gone within 60 minutes. ISNB may cause mild shortness of breath, a hoarse voice, blurry vision, unequal pupils, or drooping of the face on the same side as the nerve block.  These symptoms will usually resolve with the numbness.  Very rarely the procedure itself can cause mild seizures. If needed, your surgeon will give you a prescription for pain medication.  It will take about 60 minutes for the oral pain medication to become fully effective.  So, it is recommended that you start  taking this medication before the nerve block first begins to wear off, or when you first begin to feel discomfort. Take your pain medication only as prescribed.  Pain medication can cause sedation and decrease your breathing if you take more than you need for the level of pain that you have. Nausea is a common side effect of many pain medications.  You may want to eat something before taking your pain medicine to prevent nausea. After an Interscalene nerve block, you cannot feel pain, pressure or extremes in temperature in the effected arm.  Because your arm is numb it is at an increased risk for injury.  To decrease the possibility of injury, please practice the following:  While you are awake change the position of your arm frequently to prevent too much pressure on any one area for prolonged periods of time.  If you have a cast or tight dressing, check the color or your fingers every couple of hours.  Call your surgeon with the appearance of any discoloration (white or blue). If you are given a sling to wear before you go home, please wear it  at all times until the block has completely worn off.  Do not get up at night without your sling. Please contact [facility] or your surgeon if you do not begin to regain sensation after 7 days from the surgery.  Anesthesia may be contacted by calling the Same Day Surgery Department, Mon. through Fri., 6  am to 4 pm at [phone].   If you experience any other problems or concerns, please contact your surgeon's office. If you experience severe or prolonged shortness of breath go to the nearest emergency department. POLAR CARE INFORMATION  MassAdvertisement.it  How to use B[employer] Polar Care [facility] Therapy System?  YouTube   ShippingScam.co.uk  OPERATING INSTRUCTIONS  Start the product With dry hands, connect the transformer to the electrical connection located on the top of the cooler. Next, plug the transformer into an  appropriate electrical outlet. The unit will automatically start running at this point.  To stop the pump, disconnect electrical power.  Unplug to stop the product when not in use. Unplugging the Polar Care unit turns it off. Always unplug immediately after use. Never leave it plugged in while unattended. Remove pad.    FIRST ADD WATER TO FILL LINE, THEN ICE---Replace ice when existing ice is almost melted  1 Discuss Treatment with your Licensed Health Care Practitioner and Use Only as Prescribed 2 Apply Insulation Barrier & Cold Therapy Pad 3 Check for Moisture 4 Inspect Skin Regularly  Tips and Trouble Shooting Usage Tips 1. Use cubed or chunked ice for optimal performance. 2. It is recommended to drain the Pad between uses. To drain the pad, hold the Pad upright with the hose pointed toward the ground. Depress the black plunger and allow water to drain out. 3. You may disconnect the Pad from the unit without removing the pad from the affected area by depressing the silver tabs on the hose coupling and gently pulling the hoses apart. The Pad and unit will seal itself and will not leak. Note: Some dripping during release is normal. 4. DO NOT RUN PUMP WITHOUT WATER! The pump in this unit is designed to run with water. Running the unit without water will cause permanent damage to the pump. 5. Unplug unit before removing lid.  TROUBLESHOOTING GUIDE Pump not running, Water not flowing to the pad, Pad is not getting cold 1. Make sure the transformer is plugged into the wall outlet. 2. Confirm that the ice and water are filled to the indicated levels. 3. Make sure there are no kinks in the pad. 4. Gently pull on the blue tube to make sure the tube/pad junction is straight. 5. Remove the pad from the treatment site and ll it while the pad is lying at; then reapply. 6. Confirm that the pad couplings are securely attached to the unit. Listen for the double clicks (Figure 1) to confirm the pad  couplings are securely attached.  Leaks    Note: Some condensation on the lines, controller, and pads is unavoidable, especially in warmer climates. 1. If using a Breg Polar Care Cold Therapy unit with a detachable Cold Therapy Pad, and a leak exists (other than condensation on the lines) disconnect the pad couplings. Make sure the silver tabs on the couplings are depressed before reconnecting the pad to the pump hose; then confirm both sides of the coupling are properly clicked in. 2. If the coupling continues to leak or a leak is detected in the pad itself, stop using it and call [employer] Customer Care at [phone].  Cleaning After use, empty and dry the unit with a soft cloth. Warm water and mild detergent may be used occasionally to clean the pump and tubes.  WARNING: The Polar Care Cube can be cold enough to cause serious injury, including full skin necrosis. Follow these Operating Instructions, and carefully read the Product  Insert (see pouch on side of unit) and the Cold Therapy Pad Fitting Instructions (provided with each Cold Therapy Pad) prior to use.

## 2022-09-01 NOTE — Op Note (Signed)
09/01/2022  11:38 AM  PATIENT:  Raymond Humphrey  62 y.o. male  PRE-OPERATIVE DIAGNOSIS:  Right Shoulder Rotator Cuff Tear  POST-OPERATIVE DIAGNOSIS:  Right Shoulder Rotator Cuff Tear, anterior and superior labral tear, chondrosis of the glenohumeral joint, partial tear of the biceps tendon, subacromial impingement and acromioclavicular joint arthrosis  PROCEDURE:   Right shoulder arthroscopic subacromial decompression and distal clavicle excision Right shoulder arthroscopic anterior labral repair Mini open rotator cuff repair Mini open biceps tenodesis  SURGEON:  Surgeon(s) and Role:    Juanell Fairly, MD - Primary  ANESTHESIA:   general and paracervical block  PREOPERATIVE INDICATIONS:  Raymond Humphrey is a  62 y.o. male with a diagnosis of Right Shoulder Rotator Cuff Tear, confirmed by MRI who failed conservative measures and elected for surgical management.  The risks benefits and alternatives were discussed with the patient preoperatively including but not limited to the risks of infection, bleeding, nerve injury, persistent pain or weakness, failure of the hardware, re-tear of the rotator cuff and the need for further surgery. . Patient understood these risks and wished to proceed.  OPERATIVE IMPLANTS: Smith & Nephew Multifix anchors x 3 & Smith and Nephew Q Fix anchors x 2.  Arthrex biosuture tak anchors x2 for labral fixation  OPERATIVE FINDINGS: Full-thickness and retracted rotator cuff tear involving the supra and infraspinatus tendons.  Anterior inferior labral tear with increased anterior translation of the humeral head.  SLAP tear.  Partial tear of the long head of the biceps tendon with extensive tendinosis.  Subacromial impingement with resulting bursitis.  Advance acromioclavicular joint arthrosis  OPERATIVE PROCEDURE: The patient was met in the preoperative area with his wife at the bedside. The right shoulder was signed with the word yes and my initials  according the hospital's correct site of surgery protocol.  A preop history and physical was performed at the bedside.  The right shoulder was marked according to the hospital's correct site of surgery protocol.  The patient underwent placement of an interscalene block with Exparel by the anesthesia service in the preoperative area.  The patient was then brought to the operating room where heunderwent general endotracheal intubation.  The patient was placed in a beachchair position.  A spider arm positioner was used for this case. Examination under anesthesia revealed no limitation of motion or obvious instability with load shift testing. The patient had a negative sulcus sign.  The patient was prepped and draped in a sterile fashion. A timeout was performed to verify the patient's name, date of birth, medical record number, correct site of surgery and correct procedure to be performed there was also used to verify the patient received antibiotics that all appropriate instruments, implants and radiographs studies were available in the room. Once all in attendance were in agreement case began.  Bony landmarks were drawn out with a surgical marker along with proposed arthroscopy incisions. These were pre-injected with 1% lidocaine plain. An 11 blade was used to establish a posterior portal through which the arthroscope was placed in the glenohumeral joint. A full diagnostic examination of the shoulder was performed.  the anterior portal was established under direct visualization with an 18-gauge spinal needle. A 5.32mm arthroscopic cannula was placed through this anterior portal.   Patient was found to have an anterior inferior labral tear.  The 5.75 mm arthroscopic cannula was replaced by a 7.0 mm cannula.  A lateral portal was established using an 18-gauge spinal needle for localization.  An arthroscopic elevator was  used to mobilize the anterior inferior labrum from the osseous glenoid.  The bone between  the labrum and glenoid was debrided using a tapered shaver blade until punctate bleeding was identified.  Then two Arthrex bio suture tack anchors were then placed along the anterior inferior glenoid.  A single limb of each suture from the anchors were passed under the labrum and an arthroscopic knot tying technique was used to repair the anterior labral tear.  The intra-articular portion of the biceps tendon was found to have advanced intratendinous degeneration with significant thickening of the tendon.  SLAP tear was also observed which destabilize the biceps anchor.  Therefore the decision was made to perform a tenodesis. An Arthocare Perfect Pass suture placed in the biceps tendon and an arthroscopic tenotomy was performed arthroscopy using an a 90 degree Smith & Nephew werewolf wand.    The arthroscope was then placed in the subacromial space. A lateral portal was then established using an 18-gauge spinal needle for localization. Extensive bursitis was encountered and debrided using a 4.0 resector shaver blade and a 90 ArthroCare wand from the lateral portal. A subacromial decompression was also performed using a 4.5 mm bone cutter shaver blade from the lateral portal and a distal clavicle excision was performed from the anterior portal.    Three Smith & Nephew Perfect Pass sutures were placed in the lateral border of the rotator cuff tear. The greater tuberosity was debrided using a 4.28mm bone cutter shaver blade to remove all remaining torn fibers of the rotator cuff.  Debridement was performed until punctate bleeding was seen at the greater tuberosity footprint, which will allow for rotator cuff healing.  Final arthroscopic images were taken and all arthroscopic instruments were then removed and the mini-open portion of the procedure began.   A saber-type incision was made along the lateral border of the acromion. The deltoid muscle was identified and split in line with its fibers which allowed  visualization of the rotator cuff.  The biceps tendon and its associated tagging suture were brought out through the deltoid split.  The Perfect Pass suture previously placed in the lateral border of the rotator cuff were also brought out through the deltoid split.     A second perfect pass suture was placed in the biceps tendon and approximately 10 mm of the intra-articular biceps was resected using electrocautery.   A tenodesis was performed placing the biceps tendon at the top of the bicipital groove with a single [employer] MultiFix anchor.    Two additional perfect pass sutures were then placed through the lateral border of the supraspinatus tear. The lateral edge of the rotator cuff was debrided until healthy, viable tissue remained.  Two Q fix anchors were then placed at the articular margin of the humeral head. The 4 limbs of each anchor were then passed medially through the rotator cuff with a perfect pass suture passer. These were clamped with a hemostat for later medial row fixation.  The five perfect pass sutures from the lateral border of the rotator cuff were then anchored to the greater tuberosity of the humeral head using two Smith & Nephew Multifix anchors. These anchors were tensioned to allow for anatomic reduction of the rotator cuff to the greater tuberosity footprint.  The medial row repair was then completed using an arthroscopic knot tying technique with the Q fix anchor sutures.  Once all sutures were tied down, arthroscopic images of the double row repair were taken with the arthroscope  both externally and arthroscopically from the glenohumeral joint.  All incisions were copiously irrigated. The deltoid fascia was repaired using a 0 Vicryl suturean interrupted fashion. .  The subcutaneous tissue of all incisions were closed with a 2-0 Vicryl. Skin closure for the arthroscopic incisions was performed with 4-0 nylon. The skin edges of the saber incision were approximated with a  running 4-0 undyed Monocryl.  A dry sterile dressing was applied.  The patient was placed in an abduction sling, with a Polar Care sleeve.  All sharp and instrument counts were correct at the conclusion of the case. I was scrubbed and present for the entire case. I spoke with the patient's family postoperatively to let them know the case had been performed without complication and the patient was stable in recovery room.

## 2022-09-01 NOTE — Anesthesia Procedure Notes (Signed)
Anesthesia Regional Block: Interscalene brachial plexus block   Pre-Anesthetic Checklist: , timeout performed,  Correct Patient, Correct Site, Correct Laterality,  Correct Procedure, Correct Position, site marked,  Risks and benefits discussed,  Surgical consent,  Pre-op evaluation,  At surgeon's request and post-op pain management  Laterality: Right and Upper  Prep: chloraprep       Needles:  Injection technique: Single-shot  Needle Type: Stimiplex     Needle Length: 5cm  Needle Gauge: 22     Additional Needles:   Procedures:,,,, ultrasound used (permanent image in chart),,    Narrative:  Start time: 09/01/2022 7:59 AM End time: 09/01/2022 8:02 AM Injection made incrementally with aspirations every 5 mL.  Performed by: Personally  Anesthesiologist: Lenard Simmer, MD  Additional Notes: Functioning IV was confirmed and monitors were applied.  A 50mm 22ga Stimuplex needle was used. Sterile prep and drape,hand hygiene and sterile gloves were used.  Negative aspiration and negative test dose prior to incremental administration of local anesthetic. The patient tolerated the procedure well.

## 2022-09-01 NOTE — H&P (Signed)
PREOPERATIVE H&P  Chief Complaint: Right Shoulder Rotator Cuff Tear  HPI: Raymond Humphrey is a 62 y.o. male who presents for preoperative history and physical with a diagnosis of Right Shoulder Rotator Cuff Tear, confirmed by MRI. Symptoms of right shoulder pain, weakness and limited range of motion are significantly impairing activities of daily living.  Patient has failed nonoperative management wished to proceed with a right shoulder rotator cuff repair.  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   Erectile dysfunction    a.) on PDE5i (sildenafil)   History of 2019 novel coronavirus disease (COVID-19) 05/27/2022   History of kidney stones    Hx of cardiovascular stress test    GXT (2/16): no ischemic EKG changes   Hyperlipidemia    Hypertension    Mild pulmonic regurgitation and RV dilation by prior echocardiogram    Dilated RV on 2009 echo.  However, echo (10/12) showed EF 55%, basal to mid inferior hypokinesis, grade II diastolic dysfunction, normal RV size and systolic function, PA systolic pressure;  Echocardiogram (01/28/11):  EF 55%, inf mild HK, Gr 2 DD, mild LAE, mild RAE   NSTEMI (non-ST elevated myocardial infarction) (HCC) 05/09/2007   a.) Tx'd in [facility] 05/09/2007 --> 30% prox posterolateral OM, 60% pRCA, occluded dRCA --> PTCA + aspiration thrombectomy + PCI (4.5 x 12 mm Liberta BMS pRCA and 3.0 x 16 mm Liberta BMS dRCA)   Obesity    OSA on CPAP    Secondary polycythemia    a.) smoking + obesity + OSAH; b.) JAK2 + EPO 03/2019 normal --> not polycythemia vera   Smoking    Past Surgical History:  Procedure Laterality Date   APPENDECTOMY     CORONARY ANGIOPLASTY WITH STENT PLACEMENT  Left 05/09/2007   Procedure: CORONARY ANGIOPLASTY WITH STENT PLACEMENT (BMS x 2); Location: [facility] [place], [place]); Surgeon: Paulina Fusi, MD   CORONARY THROMBECTOMY  05/09/2007   Procedure: CORONARY THROMBECTOMY; Location: [facility] [place], [place]); Surgeon: Paulina Fusi, MD   cosmetic skin removal     Social History   Socioeconomic History   Marital status: Married    Spouse name: Susanze   Number of children: 1   Years of education: Not on file   Highest education level: Not on file  Occupational History   Occupation: Truck Air traffic controller: [employer]  Tobacco Use   Smoking status: Every Day    Packs/day: 0.50    Years: 35.00    Additional pack years: 0.00    Total pack years: 17.50    Types: Cigarettes   Smokeless tobacco: Never  Vaping Use   Vaping Use: Never used  Substance and Sexual Activity   Alcohol use: Yes    Comment: 6pack a week when home   Drug use: No   Sexual activity: Yes  Other Topics Concern   Not on file  Social History Narrative   Not on file   Social Determinants of Health   Financial Resource Strain: Not on file  Food Insecurity: Not on file  Transportation Needs: Not on file  Physical Activity: Not on file  Stress: Not on file  Social Connections: Not on file   Family History  Problem Relation Age of Onset   Heart attack Father    Heart attack Cousin    Heart attack Maternal Grandfather    No Known Allergies Prior to Admission medications   Medication Sig Start Date End Date Taking? Authorizing Provider  acetaminophen (TYLENOL) 500 MG tablet Take 1,000 mg by mouth every 6 (six) hours as needed for mild pain.   Yes [provider]  aspirin EC 81 MG tablet Take 81 mg by mouth as needed. 02/07/12  Yes Laurey Morale, MD  b complex vitamins capsule Take 1 capsule by mouth daily.   Yes [provider]  Multiple Vitamin (MULTIVITAMIN WITH MINERALS) TABS tablet Take 1 tablet by mouth  daily.   Yes [provider]  sildenafil (VIAGRA) 50 MG tablet TAKE 1 TABLET BY MOUTH AS NEEDED FOR ERECTILE DYSFUNCTION 07/02/21   Corky Downs, MD     Positive ROS: All other systems have been reviewed and were otherwise negative with the exception of those mentioned in the HPI and as above.  Physical Exam: General: Alert, no acute distress Cardiovascular: Regular rate and rhythm, no murmurs rubs or gallops.  No pedal edema Respiratory: Clear to auscultation bilaterally, no wheezes rales or rhonchi. No cyanosis, no use of accessory musculature GI: No organomegaly, abdomen is soft and non-tender nondistended with positive bowel sounds. Skin: Skin intact, no lesions within the operative field. Neurologic: Sensation intact distally Psychiatric: Patient is competent for consent with normal mood and affect Lymphatic: No cervical lymphadenopathy  MUSCULOSKELETAL: Right shoulder: The patient can forward elevate and abduct to approximately 150 to 160 degrees but has pain in the mid range of abduction. There is increased pain and weakness of active shoulder abduction. There is weakness with external rotation as well but no significant weakness with internal rotation. The patient has positive impingement signs but no apprehension or instability and has full digital wrist and elbow range of motion, intact sensation to light touch and a palpable radial pulse.    Radiology:  I have personally reviewed the MRI images of the right shoulder as well as radiology report. The MRI of the right shoulder shows a large rotator cuff tear involving the supraspinatus and a near complete tear of the infraspinatus. The supraspinatus is completely torn and retracted. There is fraying of the subscapularis interstitial but no tear. Glenohumeral joint arthropathy is observed, and a chronically torn and worn anterior, superior and anterior inferior labrum is observed. Moderate AC joint arthropathy is observed.  Tendinosis of the biceps tendon is observed. No substantial fatty atrophy of the rotator cuff musculature is observed.   Assessment: Right Shoulder Rotator Cuff Tear  Plan: Plan for Procedure(s): I reviewed the details of the operation as well as the postoperative course in detail with the patient and his wife.  I answered all their questions.  A preop history and physical was performed at the bedside.  I marked the right shoulder according to the hospital's correct site surgery protocol.  I discussed the risks and benefits of surgery. The risks include but are not limited to infection, bleeding, nerve or blood vessel injury, joint stiffness or loss of motion, persistent pain, weakness or instability, retear of the rotator cuff, Popeye deformity of the biceps, failure of the repair or inability to repair rotator cuff tear and the need for further surgery. Patient understood these risks and wished to proceed.    Juanell Fairly, MD   09/01/2022 7:58  AM

## 2022-09-02 ENCOUNTER — Encounter: Payer: Self-pay | Admitting: Orthopedic Surgery

## 2022-09-03 NOTE — Anesthesia Postprocedure Evaluation (Signed)
Anesthesia Post Note  Patient: Raymond Humphrey  Procedure(s) Performed: SHOULDER ARTHROSCOPY WITH OPEN ROTATOR CUFF REPAIR AND DISTAL CLAVICLE ACROMINECTOMY (Right) ARTHROSCOPY SHOULDER (Right: Shoulder)  Patient location during evaluation: PACU Anesthesia Type: General Level of consciousness: awake and alert Pain management: pain level controlled Vital Signs Assessment: post-procedure vital signs reviewed and stable Respiratory status: spontaneous breathing, nonlabored ventilation, respiratory function stable and patient connected to nasal cannula oxygen Cardiovascular status: blood pressure returned to baseline and stable Postop Assessment: no apparent nausea or vomiting Anesthetic complications: no   No notable events documented.   Last Vitals:  Vitals:   09/01/22 1145 09/01/22 1205  BP:  122/64  Pulse:  69  Resp:  18  Temp: (!) 36.3 C (!) 35.9 C  SpO2:  97%    Last Pain:  Vitals:   09/01/22 1205  TempSrc: Temporal  PainSc: 0-No pain                 Lenard Simmer

## 2022-09-08 ENCOUNTER — Ambulatory Visit
Admission: RE | Admit: 2022-09-08 | Discharge: 2022-09-08 | Disposition: A | Discharge status: Home / Self Care | Payer: 59 | Source: Ambulatory Visit | Attending: Internal Medicine | Admitting: Internal Medicine

## 2022-09-08 DIAGNOSIS — F17210 Nicotine dependence, cigarettes, uncomplicated: Secondary | ICD-10-CM | POA: Insufficient documentation

## 2022-09-08 DIAGNOSIS — Z122 Encounter for screening for malignant neoplasm of respiratory organs: Secondary | ICD-10-CM

## 2022-09-08 DIAGNOSIS — Z87891 Personal history of nicotine dependence: Secondary | ICD-10-CM | POA: Insufficient documentation

## 2022-09-12 ENCOUNTER — Other Ambulatory Visit: Payer: Self-pay | Admitting: Acute Care

## 2022-09-12 DIAGNOSIS — F17210 Nicotine dependence, cigarettes, uncomplicated: Secondary | ICD-10-CM

## 2022-09-12 DIAGNOSIS — Z122 Encounter for screening for malignant neoplasm of respiratory organs: Secondary | ICD-10-CM

## 2022-09-12 DIAGNOSIS — Z87891 Personal history of nicotine dependence: Secondary | ICD-10-CM

## 2022-09-20 ENCOUNTER — Encounter: Payer: Self-pay | Admitting: Cardiovascular Disease

## 2022-09-20 ENCOUNTER — Ambulatory Visit: Payer: 59 | Attending: Cardiovascular Disease | Admitting: Cardiovascular Disease

## 2022-09-20 VITALS — BP 112/72 | HR 71 | Ht 68.0 in | Wt 217.0 lb

## 2022-09-20 DIAGNOSIS — IMO0001 Reserved for inherently not codable concepts without codable children: Secondary | ICD-10-CM

## 2022-09-20 DIAGNOSIS — E782 Mixed hyperlipidemia: Secondary | ICD-10-CM

## 2022-09-20 DIAGNOSIS — F17200 Nicotine dependence, unspecified, uncomplicated: Secondary | ICD-10-CM

## 2022-09-20 DIAGNOSIS — G4733 Obstructive sleep apnea (adult) (pediatric): Secondary | ICD-10-CM

## 2022-09-20 DIAGNOSIS — I2510 Atherosclerotic heart disease of native coronary artery without angina pectoris: Secondary | ICD-10-CM | POA: Diagnosis not present

## 2022-09-20 DIAGNOSIS — Z024 Encounter for examination for driving license: Secondary | ICD-10-CM

## 2022-09-20 NOTE — Patient Instructions (Signed)
Medication Instructions:  Your physician recommends that you continue on your current medications as directed. Please refer to the Current Medication list given to you today.  *If you need a refill on your cardiac medications before your next appointment, please call your pharmacy*   Lab Work: Your physician recommends that you return for lab work in: the next week or 2 for FASTING lipid/liver panel  If you have labs (blood work) drawn today and your tests are completely normal, you will receive your results only by: MyChart Message (if you have MyChart) OR A paper copy in the mail If you have any lab test that is abnormal or we need to change your treatment, we will call you to review the results.   Testing/Procedures: Your physician has requested that you have an exercise tolerance test.  Please also follow instruction sheet, as given. This will take place at [address], [address] Do not drink or eat foods with caffeine for 24 hours before the test. (Chocolate, coffee, tea, or energy drinks) If you use an inhaler, bring it with you to the test. Do not smoke for 4 hours before the test. Wear comfortable shoes and clothing. To be done in July.   Your physician has requested that you have an echocardiogram. Echocardiography is a painless test that uses sound waves to create images of your heart. It provides your doctor with information about the size and shape of your heart and how well your heart's chambers and valves are working. This procedure takes approximately one hour. There are no restrictions for this procedure. Please do NOT wear cologne, perfume, aftershave, or lotions (deodorant is allowed). Please arrive 15 minutes prior to your appointment time. This will take place at [address]. **To do in July**    Follow-Up: At [facility], you and your health needs are our priority.  As part of our continuing mission to provide you with exceptional heart  care, we have created designated Provider Care Teams.  These Care Teams include your primary Cardiologist (physician) and Advanced Practice Providers (APPs -  Physician Assistants and Nurse Practitioners) who all work together to provide you with the care you need, when you need it.  We recommend signing up for the patient portal called "MyChart".  Sign up information is provided on this After Visit Summary.  MyChart is used to connect with patients for Virtual Visits (Telemedicine).  Patients are able to view lab/test results, encounter notes, upcoming appointments, etc.  Non-urgent messages can be sent to your provider as well.   To learn more about what you can do with MyChart, go to [email or web address].    Your next appointment:   We will see you on an as needed basis.  Provider:   Nanetta Batty, MD

## 2022-09-20 NOTE — Progress Notes (Signed)
09/20/2022 Raymond Humphrey   1960/05/09  [id number]  Primary Physician Corky Downs, MD Primary Cardiologist: Runell Gess MD Nicholes Calamity, [place]  HPI:  Raymond Humphrey is a 62 y.o. moderately overweight married Caucasian male father of 4 stepchildren, grandfather of 10 grandchildren who is accompanied by his wife and Haywood Lasso today.  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  He works as a Agricultural consultant.  He was last seen by Tereso Newcomer, PA-C back in 2018.  His risk factors include ongoing tobacco abuse of three quarters of a pack a day for last 30 years.  His father had a myocardial infarction in his 67s.  He had a heart attack in 2009 in [place] and had AngioJet thrombectomy, PCI and stenting.  2D echo since that time of revealed preserved LV function.  He does have obstructive sleep apnea on CPAP.  He lost close to 150 pounds over the last year as result of diet and feels clinically improved.  He denies chest pain or shortness of breath.   Current Meds  Medication Sig   acetaminophen (TYLENOL) 500 MG tablet Take 1,000 mg by mouth every 6 (six) hours as needed for mild pain.   aspirin EC 81 MG tablet Take 81 mg by mouth as needed.   b complex vitamins capsule Take 1 capsule by mouth daily.   bisacodyl (DULCOLAX) 5 MG EC tablet Take 1 tablet (5 mg total) by mouth daily as needed for moderate constipation.   Multiple Vitamin (MULTIVITAMIN WITH MINERALS) TABS tablet Take 1 tablet by mouth daily.   oxyCODONE (OXY IR/ROXICODONE) 5 MG immediate release tablet Take 1 tablet by mouth every 4 (four) hours as needed.   tadalafil (CIALIS) 5 MG tablet Take 1 tablet by mouth daily.     No Known Allergies  Social History   Socioeconomic History   Marital status: Married    Spouse name: Comptroller   Number of children: 1   Years of education: Not on file   Highest education level: Not on file  Occupational History   Occupation: Truck  Air traffic controller: [employer]  Tobacco Use   Smoking status: Every Day    Packs/day: 0.50    Years: 35.00    Additional pack years: 0.00    Total pack years: 17.50    Types: Cigarettes   Smokeless tobacco: Never  Vaping Use   Vaping Use: Never used  Substance and Sexual Activity   Alcohol use: Yes    Comment: 6pack a week when home   Drug use: No   Sexual activity: Yes  Other Topics Concern   Not on file  Social History Narrative   Not on file   Social Determinants of Health   Financial Resource Strain: Not on file  Food Insecurity: Not on file  Transportation Needs: Not on file  Physical Activity: Not on file  Stress: Not on file  Social Connections: Not on file  Intimate Partner Violence: Not on file     Review of Systems: General: negative for chills, fever, night sweats or weight changes.  Cardiovascular: negative for chest pain, dyspnea on exertion, edema, orthopnea, palpitations, paroxysmal nocturnal dyspnea or shortness of breath Dermatological: negative for rash Respiratory: negative for cough or wheezing Urologic: negative for hematuria Abdominal: negative for nausea, vomiting, diarrhea, bright red blood per rectum, melena, or hematemesis Neurologic: negative for visual changes, syncope, or dizziness All other systems  reviewed and are otherwise negative except as noted above.    Blood pressure 112/72, pulse 71, height 5\' 8"  (1.727 m), weight 217 lb (98.4 kg), SpO2 97 %.  General appearance: alert and no distress Neck: no adenopathy, no carotid bruit, no JVD, supple, symmetrical, trachea midline, and thyroid not enlarged, symmetric, no tenderness/mass/nodules Lungs: clear to auscultation bilaterally Heart: regular rate and rhythm, S1, S2 normal, no murmur, click, rub or gallop Extremities: extremities normal, atraumatic, no cyanosis or edema Pulses: 2+ and symmetric Skin: Skin color, texture, turgor normal. No rashes or lesions Neurologic: Grossly  normal  EKG sinus rhythm at 71 with a Q wave in lead III and otherwise nonspecific ST and T wave changes.  Personally reviewed this EKG.  ASSESSMENT AND PLAN:   Coronary artery disease, non-occlusive History of CAD status post myocardial infarction in 2009 in [place].  He had AngioJet thrombectomy, PCI and stenting of his right coronary artery.  Echo was done since that time and showed preserved LV function.  He denies chest pain.  Hyperlipidemia History of hyperlipidemia not on statin therapy with question of statin intolerance.  Will recheck a lipid liver profile  OSA (obstructive sleep apnea) History of obstructive sleep apnea on CPAP  Smoking Smokes three-quarter pack a day for last 30 years recalcitrant risk factor modification.  Morbid obesity (HCC) Weight today is 217.  He is lost 150 pounds over the last year.  This is a result of diet.  Encounter for commercial driving license (CDL) exam Patient presents today for obtaining test for DOT physical.  He needs a GXT and a 2D echocardiogram which we will order in July.     Runell Gess MD FACP,FACC,FAHA, [facility] 09/20/2022 3:19 PM

## 2022-09-20 NOTE — Assessment & Plan Note (Signed)
History of obstructive sleep apnea on CPAP. 

## 2022-09-20 NOTE — Assessment & Plan Note (Signed)
History of hyperlipidemia not on statin therapy with question of statin intolerance.  Will recheck a lipid liver profile

## 2022-09-20 NOTE — Assessment & Plan Note (Signed)
Patient presents today for obtaining test for DOT physical.  He needs a GXT and a 2D echocardiogram which we will order in July.

## 2022-09-20 NOTE — Assessment & Plan Note (Signed)
History of CAD status post myocardial infarction in 2009 in [place].  He had AngioJet thrombectomy, PCI and stenting of his right coronary artery.  Echo was done since that time and showed preserved LV function.  He denies chest pain.

## 2022-09-20 NOTE — Assessment & Plan Note (Signed)
Weight today is 217.  He is lost 150 pounds over the last year.  This is a result of diet.

## 2022-09-20 NOTE — Assessment & Plan Note (Signed)
Smokes three-quarter pack a day for last 30 years recalcitrant risk factor modification.

## 2022-11-03 ENCOUNTER — Telehealth (HOSPITAL_COMMUNITY): Payer: Self-pay

## 2022-11-03 NOTE — Telephone Encounter (Signed)
Detailed instructions left on the patient's answering machine. Asked to call back with any questions. S.Cyani Kallstrom EMTP/CCT 

## 2022-11-08 ENCOUNTER — Ambulatory Visit (HOSPITAL_BASED_OUTPATIENT_CLINIC_OR_DEPARTMENT_OTHER): Payer: 59

## 2022-11-08 ENCOUNTER — Ambulatory Visit (HOSPITAL_COMMUNITY): Payer: 59 | Attending: Cardiology

## 2022-11-08 DIAGNOSIS — G4733 Obstructive sleep apnea (adult) (pediatric): Secondary | ICD-10-CM

## 2022-11-08 DIAGNOSIS — E782 Mixed hyperlipidemia: Secondary | ICD-10-CM | POA: Diagnosis not present

## 2022-11-08 DIAGNOSIS — Z024 Encounter for examination for driving license: Secondary | ICD-10-CM

## 2022-11-08 DIAGNOSIS — F17200 Nicotine dependence, unspecified, uncomplicated: Secondary | ICD-10-CM

## 2022-11-08 DIAGNOSIS — I2510 Atherosclerotic heart disease of native coronary artery without angina pectoris: Secondary | ICD-10-CM | POA: Insufficient documentation

## 2022-11-08 LAB — EXERCISE TOLERANCE TEST
Angina Index: 0
Duke Treadmill Score: 7
Estimated workload: 8.9
Exercise duration (min): 7 min
Exercise duration (sec): 14 s
MPHR: 159 {beats}/min
Peak HR: 142 {beats}/min
Percent HR: 89 %
RPE: 18
Rest HR: 74 {beats}/min
ST Depression (mm): 0 mm

## 2022-11-08 LAB — ECHOCARDIOGRAM COMPLETE
Area-P 1/2: 2.48 cm2
S' Lateral: 3.3 cm

## 2022-11-11 ENCOUNTER — Telehealth: Payer: Self-pay | Admitting: Cardiovascular Disease

## 2022-11-11 NOTE — Telephone Encounter (Signed)
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 He ask that you put it in mychart please.

## 2022-11-11 NOTE — Telephone Encounter (Signed)
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  He needs a GXT and a 2D echocardiogram which we will order in July.  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 Advised pt MD is currently not in the office, will forward to MD and nurse for advise.

## 2022-11-17 NOTE — Telephone Encounter (Signed)
Left detailed message for pt (ok per DPR) regarding his DOT clearance letter. Will create letter and send it to him via Mychart as requested. Call back number left.

## 2023-10-20 ENCOUNTER — Other Ambulatory Visit: Payer: Self-pay | Admitting: Acute Care

## 2023-10-20 DIAGNOSIS — F17210 Nicotine dependence, cigarettes, uncomplicated: Secondary | ICD-10-CM

## 2023-10-20 DIAGNOSIS — Z87891 Personal history of nicotine dependence: Secondary | ICD-10-CM

## 2023-10-20 DIAGNOSIS — Z122 Encounter for screening for malignant neoplasm of respiratory organs: Secondary | ICD-10-CM

## 2023-11-01 ENCOUNTER — Ambulatory Visit: Attending: Cardiovascular Disease | Admitting: Cardiovascular Disease

## 2023-11-01 ENCOUNTER — Encounter: Payer: Self-pay | Admitting: Cardiovascular Disease

## 2023-11-01 VITALS — BP 118/66 | HR 76 | Ht 67.0 in | Wt 196.0 lb

## 2023-11-01 DIAGNOSIS — G4733 Obstructive sleep apnea (adult) (pediatric): Secondary | ICD-10-CM | POA: Diagnosis not present

## 2023-11-01 DIAGNOSIS — I2510 Atherosclerotic heart disease of native coronary artery without angina pectoris: Secondary | ICD-10-CM | POA: Diagnosis not present

## 2023-11-01 DIAGNOSIS — E782 Mixed hyperlipidemia: Secondary | ICD-10-CM

## 2023-11-01 NOTE — Assessment & Plan Note (Signed)
 History of hyperlipidemia currently not on statin therapy because of statin intolerance.  We will recheck a fasting lipid liver profile.

## 2023-11-01 NOTE — Assessment & Plan Note (Signed)
 History of CAD status post myocardial infarction in 2009 in [place]  at which time he had AngioJet thrombectomy, PCI and stenting.  He had preserved LV function.  He denies chest pain or shortness of breath.

## 2023-11-01 NOTE — Assessment & Plan Note (Signed)
Ongoing tobacco abuse of 1/2 pack/day recalcitrant to risk factor modification. 

## 2023-11-01 NOTE — Assessment & Plan Note (Signed)
 Morbid obesity with a BMI of 30.  He has lost 20 additional pounds since I saw him.

## 2023-11-01 NOTE — Assessment & Plan Note (Signed)
 History of obstructive sleep apnea on CPAP.

## 2023-11-01 NOTE — Patient Instructions (Signed)
 Medication Instructions:  Your physician recommends that you continue on your current medications as directed. Please refer to the Current Medication list given to you today.  *If you need a refill on your cardiac medications before your next appointment, please call your pharmacy*   Lab Work: Your physician recommends that you return for lab work in: the next week or 2 for FASTING lipid/liver panel  If you have labs (blood work) drawn today and your tests are completely normal, you will receive your results only by: MyChart Message (if you have MyChart) OR A paper copy in the mail If you have any lab test that is abnormal or we need to change your treatment, we will call you to review the results.   Follow-Up: At [facility], you and your health needs are our priority.  As part of our continuing mission to provide you with exceptional heart care, our providers are all part of one team.  This team includes your primary Cardiologist (physician) and Advanced Practice Providers or APPs (Physician Assistants and Nurse Practitioners) who all work together to provide you with the care you need, when you need it.  Your next appointment:   12 month(s)  Provider:   Lauro Portal, MD    We recommend signing up for the patient portal called MyChart.  Sign up information is provided on this After Visit Summary.  MyChart is used to connect with patients for Virtual Visits (Telemedicine).  Patients are able to view lab/test results, encounter notes, upcoming appointments, etc.  Non-urgent messages can be sent to your provider as well.   To learn more about what you can do with MyChart, go to [email or web address].

## 2023-11-01 NOTE — Progress Notes (Signed)
 11/01/2023 Raymond Humphrey   May 29, 1960  [id number]  Primary Physician Britta King, MD Primary Cardiologist: Dorn JINNY Lesches MD GENI SIX, [place], [place]  HPI:  Raymond Humphrey is a 63 y.o.  moderately overweight married Caucasian male father of 4 stepchildren, grandfather of 10 grandchildren who I last saw in the office 09/20/2022.  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 He works as a Agricultural consultant for [employer].. He was last seen by Glendia Ferrier, PA-C back in 2018. His risk factors include ongoing tobacco abuse of three quarters of a pack a day for last 30 years. His father had a myocardial infarction in his 25s. He had a heart attack in 2009 in [place]  and had AngioJet thrombectomy, PCI and stenting. 2D echo since that time of revealed preserved LV function. He does have obstructive sleep apnea on CPAP. He lost close to 150 pounds over the last year as result of diet and feels clinically improved.  Since I saw him a year ago I did obtain a 2D echocardiogram 11/08/2022 that revealed normal LV systolic function, grade 1 diastolic dysfunction without valvular abnormalities.  A routine GXT 11/08/2022 was nonischemic and low risk.  He denies chest pain or shortness of breath.  Current Meds  Medication Sig   acetaminophen  (TYLENOL ) 500 MG tablet Take 1,000 mg by mouth every 6 (six) hours as needed for mild pain.   aspirin EC 81 MG tablet Take 81 mg by mouth as needed.   b complex vitamins capsule Take 1 capsule by mouth daily.   Multiple Vitamin (MULTIVITAMIN WITH MINERALS) TABS tablet Take 1 tablet by mouth daily.   tadalafil (CIALIS) 5 MG tablet Take 1 tablet by mouth daily.     No Known Allergies  Social History   Socioeconomic History   Marital status: Married    Spouse name: Susanze   Number of children: 1   Years of education: Not on file   Highest education Humphrey: Not on file  Occupational History   Occupation: Truck  Air traffic controller: [employer]  Tobacco Use   Smoking status: Every Day    Current packs/day: 0.50    Average packs/day: 0.5 packs/day for 35.0 years (17.5 ttl pk-yrs)    Types: Cigarettes   Smokeless tobacco: Never  Vaping Use   Vaping status: Never Used  Substance and Sexual Activity   Alcohol use: Yes    Comment: 6pack a week when home   Drug use: No   Sexual activity: Yes  Other Topics Concern   Not on file  Social History Narrative   Not on file   Social Drivers of Health   Financial Resource Strain: Not on file  Food Insecurity: Not on file  Transportation Needs: Not on file  Physical Activity: Not on file  Stress: Not on file  Social Connections: Unknown (09/06/2021)   [facility]   Social Network    Social Network: Not on file  Intimate Partner Violence: Unknown (07/29/2021)   Received from [facility]   HITS    Physically Hurt: Not on file    Insult or Talk Down To: Not on file    Threaten Physical Harm: Not on file    Scream or Curse: Not on file     Review of Systems: General: negative for chills, fever, night sweats or weight changes.  Cardiovascular: negative for chest pain, dyspnea on exertion, edema, orthopnea,  palpitations, paroxysmal nocturnal dyspnea or shortness of breath Dermatological: negative for rash Respiratory: negative for cough or wheezing Urologic: negative for hematuria Abdominal: negative for nausea, vomiting, diarrhea, bright red blood per rectum, melena, or hematemesis Neurologic: negative for visual changes, syncope, or dizziness All other systems reviewed and are otherwise negative except as noted above.    Blood pressure 118/66, pulse 76, height 5' 7 (1.702 m), weight 196 lb (88.9 kg), SpO2 96%.  General appearance: alert and no distress Neck: no adenopathy, no carotid bruit, no JVD, supple, symmetrical, trachea midline, and thyroid  not enlarged, symmetric, no tenderness/mass/nodules Lungs: clear to  auscultation bilaterally Heart: regular rate and rhythm, S1, S2 normal, no murmur, click, rub or gallop Extremities: extremities normal, atraumatic, no cyanosis or edema Pulses: 2+ and symmetric Skin: Skin color, texture, turgor normal. No rashes or lesions Neurologic: Grossly normal  EKG EKG Interpretation Date/Time:  Wednesday November 01 2023 14:50:25 EDT Ventricular Rate:  76 PR Interval:  150 QRS Duration:  84 QT Interval:  372 QTC Calculation: 418 R Axis:   56  Text Interpretation: Normal sinus rhythm Low voltage QRS When compared with ECG of 24-Aug-2022 10:46, No significant change was found Confirmed by Court Carrier [phone]) on 11/01/2023 2:53:25 PM    ASSESSMENT AND PLAN:   Coronary artery disease, non-occlusive History of CAD status post myocardial infarction in 2009 in [place]  at which time he had AngioJet thrombectomy, PCI and stenting.  He had preserved LV function.  He denies chest pain or shortness of breath.  Hyperlipidemia History of hyperlipidemia currently not on statin therapy because of statin intolerance.  We will recheck a fasting lipid liver profile.  OSA (obstructive sleep apnea) History of obstructive sleep apnea on CPAP  Smoking Ongoing tobacco abuse of 1/2 pack/day recalcitrant to risk factor modification.  Morbid obesity (HCC) Morbid obesity with a BMI of 30.  He has lost 20 additional pounds since I saw him.     Carrier DOROTHA Court MD FACP,FACC,FAHA, [facility] 11/01/2023 3:03 PM

## 2024-04-08 IMAGING — CT CT CHEST LUNG CANCER SCREENING LOW DOSE W/O CM
2 of 5 series · 15 of 40 positions shown, 18 images · non-contrast
Comparison: 06/04/2020 screening chest CT.

CLINICAL DATA: 61-year-old asymptomatic male current smoker with
71.5 pack-year smoking history.



[Series 3: lung 1.00 · axial · 0.79mm/px · z∈[-1257,-923]mm · 12 of 368 slices shown, 15 images]
[im 17/368  mediastinal]
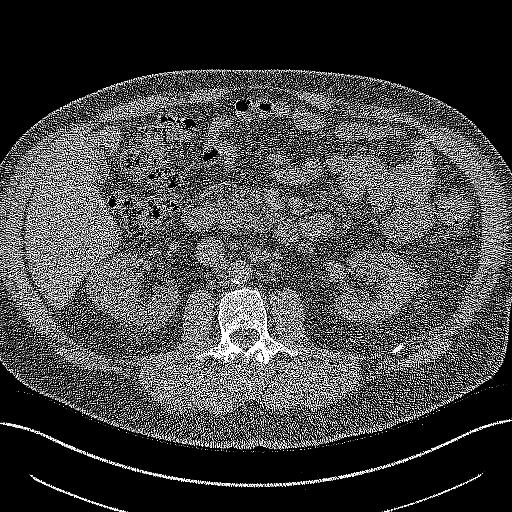
[im 17/368  lung]
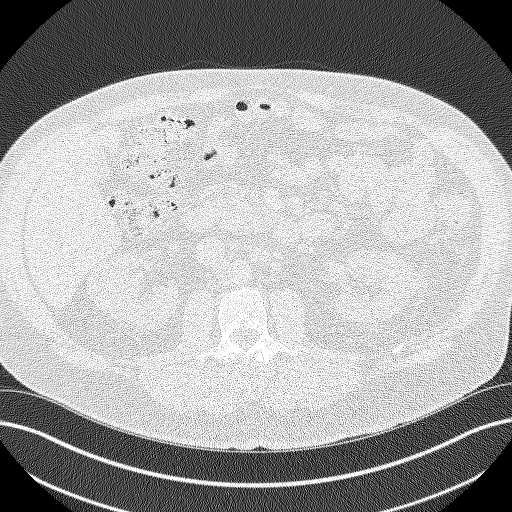
[im 51/368  lung]
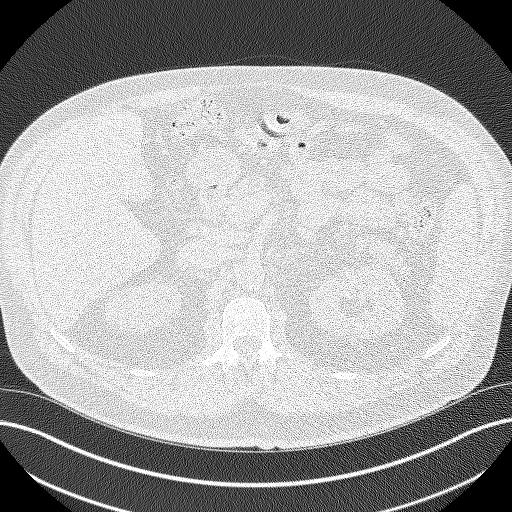
[im 84/368  lung]
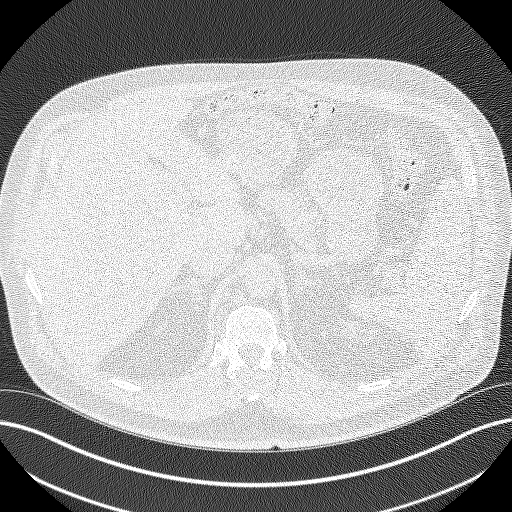
[im 117/368  lung]
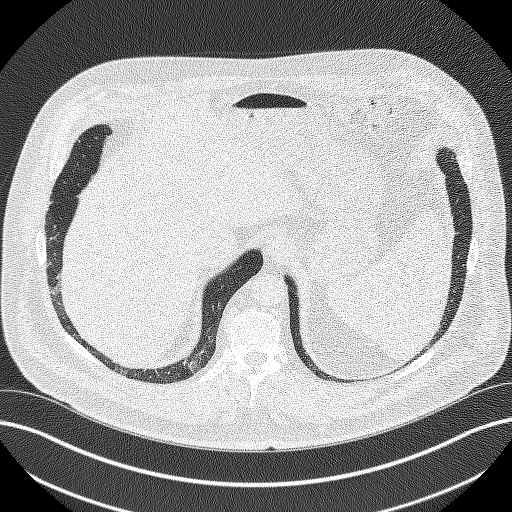
[im 134/368  mediastinal]
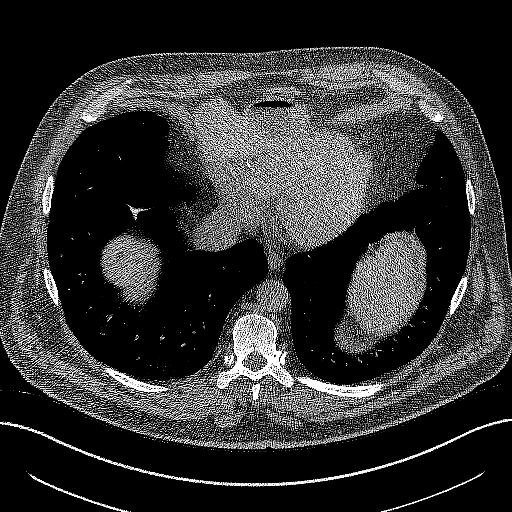
[im 134/368  lung]
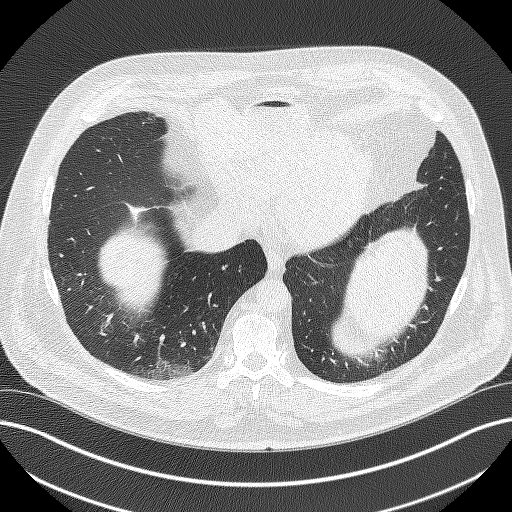
[im 167/368  lung]
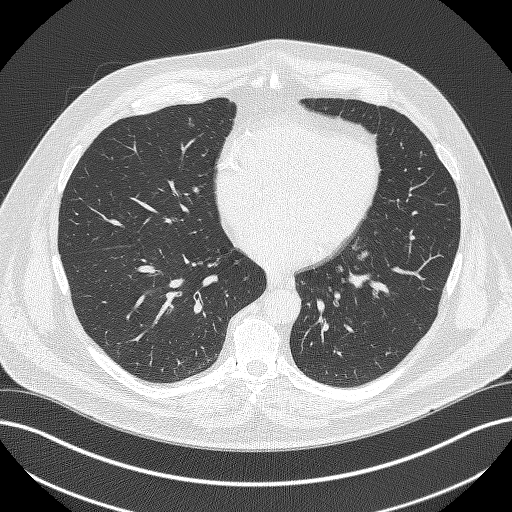
[im 201/368  lung]
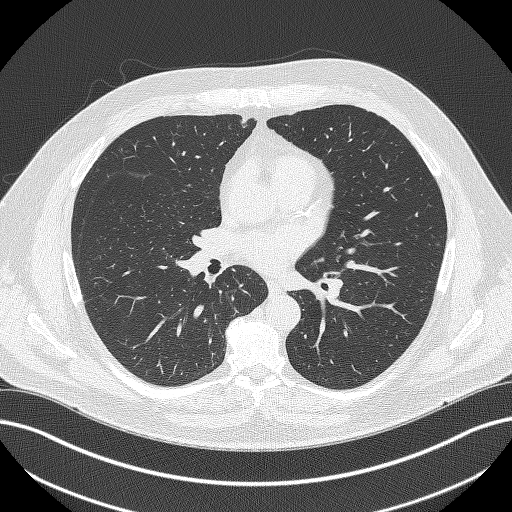
[im 234/368  lung]
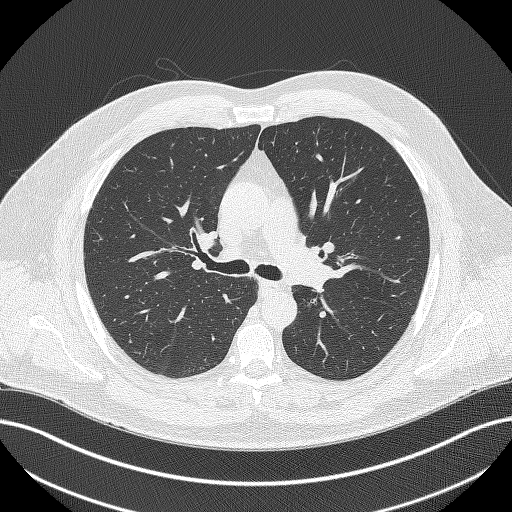
[im 251/368  mediastinal]
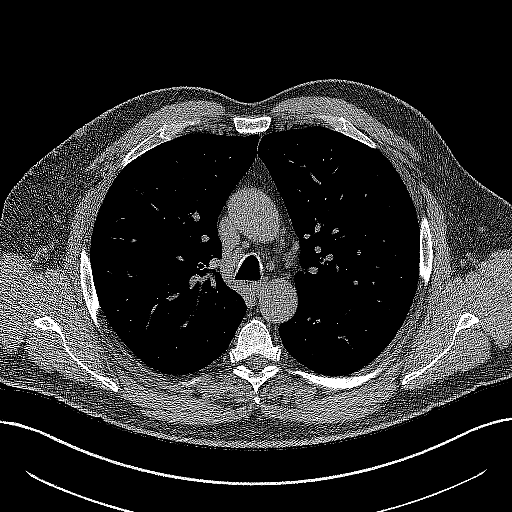
[im 251/368  lung]
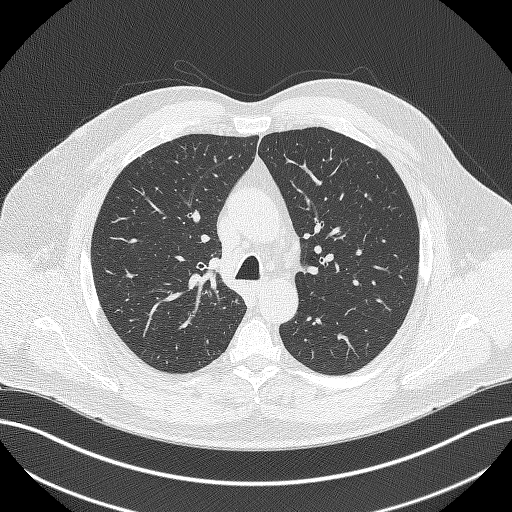
[im 284/368  lung]
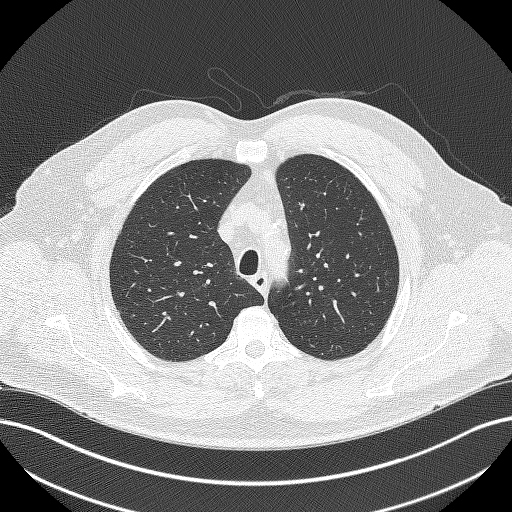
[im 317/368  lung]
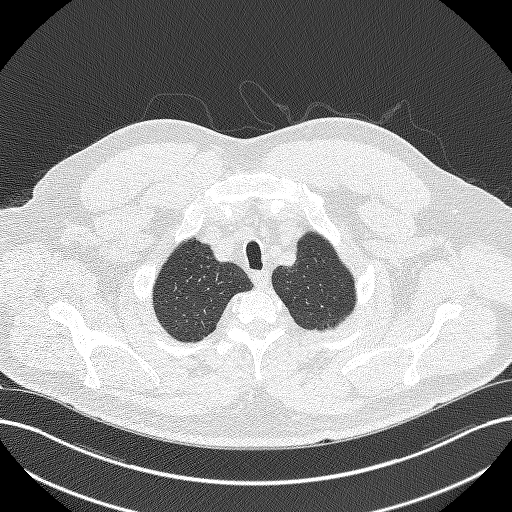
[im 351/368  lung]
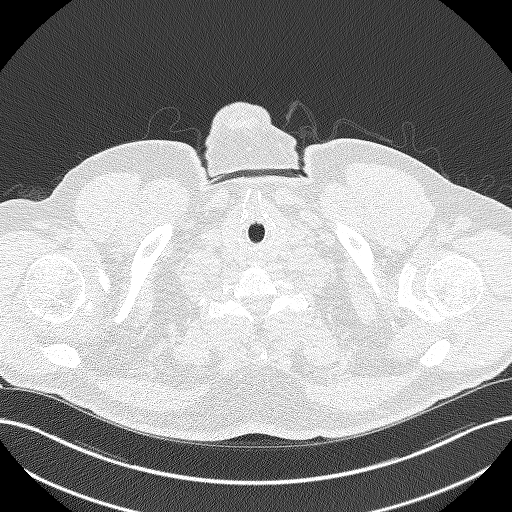

[Series 5: coronals lung 1.00 cor · coronal · 0.72mm/px · 3 of 323 slices shown]
[im 65/323  lung]
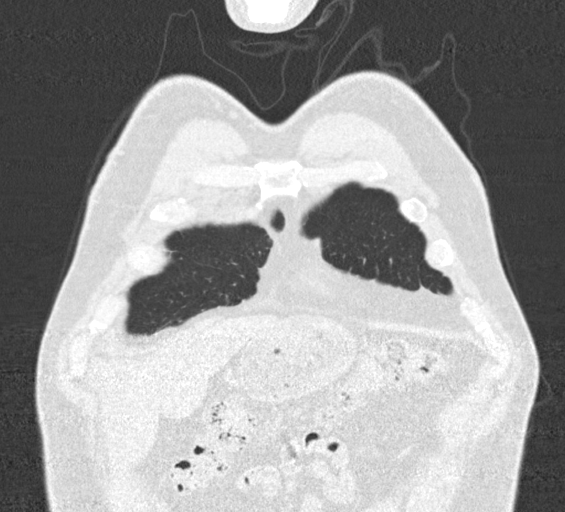
[im 129/323  lung]
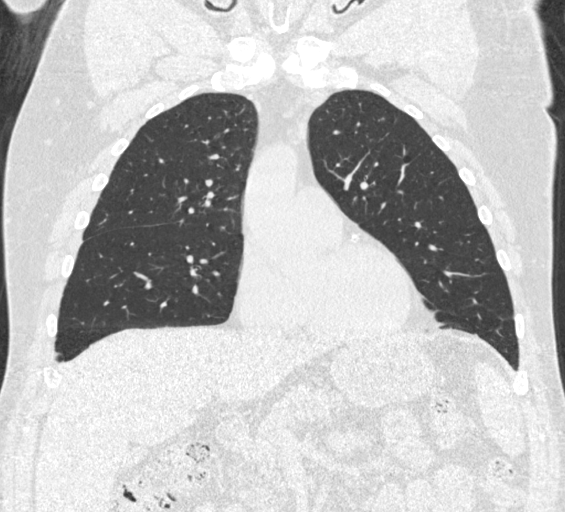
[im 194/323  lung]
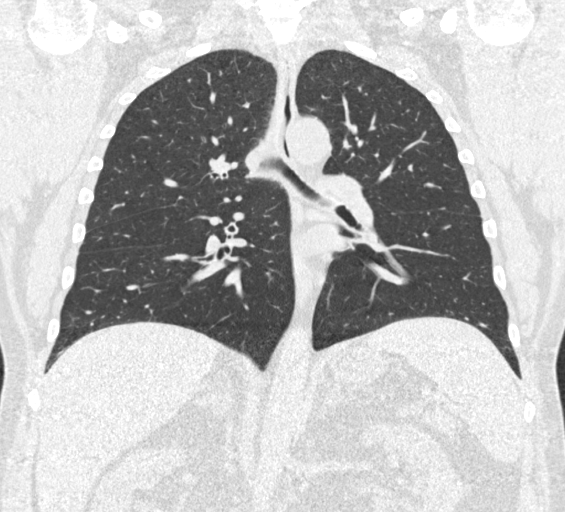

[15 of 40 positions shown; findings below may reference images not displayed]

FINDINGS: Cardiovascular: Normal heart size. No significant pericardial
effusion/thickening. Three-vessel coronary atherosclerosis.
Atherosclerotic nonaneurysmal thoracic aorta. Normal caliber
pulmonary arteries.

Mediastinum/Nodes: No discrete thyroid nodules. Unremarkable
esophagus. No pathologically enlarged axillary, mediastinal or hilar
lymph nodes, noting limited sensitivity for the detection of hilar
adenopathy on this noncontrast study.

Lungs/Pleura: No pneumothorax. No pleural effusion. Mild paraseptal
and centrilobular emphysema with diffuse bronchial wall thickening.
No acute consolidative airspace disease or lung masses. No
significant growth of previously visualized pulmonary nodules. No
new significant pulmonary nodules.

Upper abdomen: No acute abnormality.

Musculoskeletal: No aggressive appearing focal osseous lesions. Mild
thoracic spondylosis.
IMPRESSION: 1. Lung-RADS 2, benign appearance or behavior. Continue annual
screening with low-dose chest CT without contrast in 12 months.
2. Three-vessel coronary atherosclerosis.
3. Aortic Atherosclerosis (0W5WC-I86.6) and Emphysema ([id number]-HIB.2).

## 2024-04-21 ENCOUNTER — Emergency Department (HOSPITAL_COMMUNITY): Payer: Self-pay

## 2024-04-21 ENCOUNTER — Emergency Department (HOSPITAL_COMMUNITY)
Admission: EM | Admit: 2024-04-21 | Discharge: 2024-04-21 | Disposition: A | Discharge status: Home / Self Care | Payer: Self-pay | Attending: Emergency Medicine | Admitting: Emergency Medicine

## 2024-04-21 ENCOUNTER — Encounter (HOSPITAL_COMMUNITY): Payer: Self-pay

## 2024-04-21 ENCOUNTER — Other Ambulatory Visit: Payer: Self-pay

## 2024-04-21 DIAGNOSIS — Z79899 Other long term (current) drug therapy: Secondary | ICD-10-CM | POA: Insufficient documentation

## 2024-04-21 DIAGNOSIS — R0789 Other chest pain: Secondary | ICD-10-CM | POA: Insufficient documentation

## 2024-04-21 DIAGNOSIS — Z7982 Long term (current) use of aspirin: Secondary | ICD-10-CM | POA: Insufficient documentation

## 2024-04-21 DIAGNOSIS — R109 Unspecified abdominal pain: Secondary | ICD-10-CM

## 2024-04-21 DIAGNOSIS — R1013 Epigastric pain: Secondary | ICD-10-CM | POA: Insufficient documentation

## 2024-04-21 DIAGNOSIS — I2510 Atherosclerotic heart disease of native coronary artery without angina pectoris: Secondary | ICD-10-CM | POA: Insufficient documentation

## 2024-04-21 DIAGNOSIS — N2889 Other specified disorders of kidney and ureter: Secondary | ICD-10-CM | POA: Insufficient documentation

## 2024-04-21 DIAGNOSIS — Z8616 Personal history of COVID-19: Secondary | ICD-10-CM | POA: Insufficient documentation

## 2024-04-21 DIAGNOSIS — F17200 Nicotine dependence, unspecified, uncomplicated: Secondary | ICD-10-CM | POA: Insufficient documentation

## 2024-04-21 DIAGNOSIS — I10 Essential (primary) hypertension: Secondary | ICD-10-CM | POA: Insufficient documentation

## 2024-04-21 LAB — CBC WITH DIFFERENTIAL/PLATELET
Abs Immature Granulocytes: 0.03 K/uL (ref 0.00–0.07)
Basophils Absolute: 0.1 K/uL (ref 0.0–0.1)
Basophils Relative: 1 %
Eosinophils Absolute: 0.2 K/uL (ref 0.0–0.5)
Eosinophils Relative: 2 %
HCT: 47.6 % (ref 39.0–52.0)
Hemoglobin: 16.5 g/dL (ref 13.0–17.0)
Immature Granulocytes: 0 %
Lymphocytes Relative: 53 %
Lymphs Abs: 4.1 K/uL — ABNORMAL HIGH (ref 0.7–4.0)
MCH: 31.4 pg (ref 26.0–34.0)
MCHC: 34.7 g/dL (ref 30.0–36.0)
MCV: 90.5 fL (ref 80.0–100.0)
Monocytes Absolute: 0.3 K/uL (ref 0.1–1.0)
Monocytes Relative: 4 %
Neutro Abs: 3.1 K/uL (ref 1.7–7.7)
Neutrophils Relative %: 40 %
Platelets: 142 K/uL — ABNORMAL LOW (ref 150–400)
RBC: 5.26 MIL/uL (ref 4.22–5.81)
RDW: 12.6 % (ref 11.5–15.5)
WBC: 7.8 K/uL (ref 4.0–10.5)
nRBC: 0 % (ref 0.0–0.2)

## 2024-04-21 LAB — TROPONIN T, HIGH SENSITIVITY: Troponin T High Sensitivity: <15 ng/L (ref 0–19)

## 2024-04-21 LAB — COMPREHENSIVE METABOLIC PANEL WITH GFR
ALT: 16 U/L (ref 0–44)
AST: 29 U/L (ref 15–41)
Albumin: 4.4 g/dL (ref 3.5–5.0)
Alkaline Phosphatase: 93 U/L (ref 38–126)
Anion gap: 10 (ref 5–15)
BUN: 19 mg/dL (ref 8–23)
CO2: 24 mmol/L (ref 22–32)
Calcium: 8.5 mg/dL — ABNORMAL LOW (ref 8.9–10.3)
Chloride: 104 mmol/L (ref 98–111)
Creatinine, Ser: 0.95 mg/dL (ref 0.61–1.24)
GFR, Estimated: >60 mL/min
Glucose, Bld: 86 mg/dL (ref 70–99)
Potassium: 4.4 mmol/L (ref 3.5–5.1)
Sodium: 137 mmol/L (ref 135–145)
Total Bilirubin: 0.4 mg/dL (ref 0.0–1.2)
Total Protein: 6.4 g/dL — ABNORMAL LOW (ref 6.5–8.1)

## 2024-04-21 LAB — LIPASE, BLOOD: Lipase: 37 U/L (ref 11–51)

## 2024-04-21 MED ORDER — ONDANSETRON 4 MG PO TBDP: 4.0000 mg | ORAL_TABLET | Freq: Three times a day (TID) | ORAL | 0 refills | Status: AC | PRN

## 2024-04-21 MED ORDER — ALUM & MAG HYDROXIDE-SIMETH 200-200-20 MG/5ML PO SUSP
30.0000 mL | Freq: Once | ORAL | Status: AC
  Administered 2024-04-21: 30 mL via ORAL
  Filled 2024-04-21: qty 30

## 2024-04-21 MED ORDER — IOHEXOL 350 MG/ML SOLN
100.0000 mL | Freq: Once | INTRAVENOUS | Status: AC | PRN
  Administered 2024-04-21: 100 mL via INTRAVENOUS

## 2024-04-21 NOTE — Discharge Instructions (Addendum)
 We evaluated you for your abdominal pain.  Your testing in the emergency department was reassuring.  We did not see a dangerous cause of your symptoms.  We have prescribed you nausea medication to take as needed.  Since do not know the exact cause of your symptoms, please follow-up closely with your primary doctor.  Your CT scan also showed a small mass in your kidney.  This is concerning for a possible tumor.  It is important to follow-up with your primary doctor for this also.  You will likely need an MRI or a different type of CT scan.  Please return if you have any new or worsening symptoms.

## 2024-04-21 NOTE — ED Notes (Signed)
 Patient transported to CT

## 2024-04-21 NOTE — ED Provider Notes (Signed)
 " Raymond Humphrey Provider Note  CSN: [id number] Arrival date & time: 04/21/24 9162  Chief Complaint(s) Chest Pain and Abdominal Pain  HPI Raymond Humphrey is a 63 y.o. male history of coronary artery disease, hypertension, hyperlipidemia presenting to the emergency department with abdominal pain.  He reports abdominal pain epigastric region.SABRA  He also reports some chest discomfort.  He reports the pain radiates to the back.  Began this morning.  Reports nausea, no vomiting.  Has had nausea for the past few days but pain is new.  No fevers or chills.  No diarrhea.  No shortness of breath.  No syncope.   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   Erectile dysfunction    a.) on PDE5i (sildenafil )   History of 2019 novel coronavirus disease (COVID-19) 05/27/2022   History of kidney stones    Hx of cardiovascular stress test    GXT (2/16): no ischemic EKG changes   Hyperlipidemia    Hypertension    Mild pulmonic regurgitation and RV dilation by prior echocardiogram    Dilated RV on 2009 echo.  However, echo (10/12) showed EF 55%, basal to mid inferior hypokinesis, grade II diastolic dysfunction, normal RV size and systolic function, PA systolic pressure;  Echocardiogram (01/28/11):  EF 55%, inf mild HK, Gr 2 DD, mild LAE, mild RAE   NSTEMI (non-ST elevated myocardial infarction) (HCC) 05/09/2007   a.) Tx'd in [facility] 05/09/2007 --> 30% prox posterolateral OM, 60% pRCA, occluded dRCA --> PTCA + aspiration thrombectomy + PCI (4.5 x 12 mm Liberta BMS pRCA and 3.0 x 16 mm Liberta BMS dRCA)   Obesity    OSA on CPAP    Secondary polycythemia     a.) smoking + obesity + OSAH; b.) JAK2 + EPO 03/2019 normal --> not polycythemia vera   Smoking    Patient Active Problem List   Diagnosis Date Noted   Encounter for commercial driving license (CDL) exam 88/96/7978   Polycythemia    Morbid obesity (HCC) 06/13/2016   Coronary artery disease, non-occlusive 01/04/2011   RVF (right ventricular failure) (HCC) 01/04/2011   Hyperlipidemia 01/04/2011   OSA (obstructive sleep apnea) 01/04/2011   Smoking 01/04/2011   Home Medication(s) Prior to Admission medications  Medication Sig Start Date End Date Taking? Authorizing Provider  ondansetron  (ZOFRAN -ODT) 4 MG disintegrating tablet Take 1 tablet (4 mg total) by mouth every 8 (eight) hours as needed for nausea or vomiting. 04/21/24  Yes Francesca Elsie CROME, MD  acetaminophen  (TYLENOL ) 500 MG tablet Take 1,000 mg by mouth every 6 (six) hours as needed for mild pain.    [provider]  aspirin EC 81 MG tablet Take 81 mg by mouth as needed. 02/07/12   Rolan Ezra RAMAN, MD  b complex vitamins capsule Take 1 capsule by mouth daily.    [provider]  Multiple Vitamin (MULTIVITAMIN WITH MINERALS) TABS tablet Take 1 tablet by mouth daily.    [provider]  oxyCODONE  (OXY IR/ROXICODONE ) 5 MG immediate release tablet Take 1 tablet by mouth every 4 (four) hours as needed. Patient not taking: Reported on  11/01/2023    [provider]  tadalafil (CIALIS) 5 MG tablet Take 1 tablet by mouth daily.    [provider]                                                                                                                                    Past Surgical History Past Surgical History:  Procedure Laterality Date   APPENDECTOMY     CORONARY ANGIOPLASTY WITH STENT PLACEMENT Left 05/09/2007   Procedure: CORONARY ANGIOPLASTY WITH STENT PLACEMENT (BMS x 2); Location: [facility] [place], [place]); Surgeon: Garnette Legions, MD   CORONARY THROMBECTOMY   05/09/2007   Procedure: CORONARY THROMBECTOMY; Location: [facility] [place], [place]); Surgeon: Garnette Legions, MD   cosmetic skin removal     SHOULDER ARTHROSCOPY Right 09/01/2022   Procedure: ARTHROSCOPY SHOULDER;  Surgeon: Marchia Drivers, MD;  Location: [facility];  Service: Orthopedics;  Laterality: Right;   SHOULDER ARTHROSCOPY WITH OPEN ROTATOR CUFF REPAIR AND DISTAL CLAVICLE ACROMINECTOMY Right 09/01/2022   Procedure: SHOULDER ARTHROSCOPY WITH OPEN ROTATOR CUFF REPAIR AND DISTAL CLAVICLE ACROMINECTOMY;  Surgeon: Marchia Drivers, MD;  Location: [facility];  Service: Orthopedics;  Laterality: Right;   Family History Family History  Problem Relation Age of Onset   Heart attack Father    Heart attack Cousin    Heart attack Maternal Grandfather     Social History Social History[1] Allergies Patient has no known allergies.  Review of Systems Review of Systems  All other systems reviewed and are negative.   Physical Exam Vital Signs  I have reviewed the triage vital signs BP 125/77   Pulse 64   Temp 98.6 F (37 C) (Oral)   Resp 18   Ht 5' 7 (1.702 m)   Wt 88.9 kg   SpO2 96%   BMI 30.70 kg/m  Physical Exam Vitals and nursing note reviewed.  Constitutional:      General: He is not in acute distress.    Appearance: Normal appearance.  HENT:     Mouth/Throat:     Mouth: Mucous membranes are moist.  Eyes:     Conjunctiva/sclera: Conjunctivae normal.  Cardiovascular:     Rate and Rhythm: Normal rate and regular rhythm.  Pulmonary:     Effort: Pulmonary effort is normal. No respiratory distress.     Breath sounds: Normal breath sounds.  Abdominal:     General: Abdomen is flat.     Palpations: Abdomen is soft.     Tenderness: There is abdominal tenderness (epigastric region).  Musculoskeletal:     Right lower leg: No edema.     Left lower leg: No edema.  Skin:    General: Skin is warm and dry.     Capillary Refill: Capillary refill takes less than 2  seconds.  Neurological:     Mental Status: He is alert and oriented to person, place, and time. Mental status is at baseline.  Psychiatric:        Mood and Affect: Mood normal.        Behavior: Behavior normal.     ED Results and Treatments Labs (all labs ordered are listed, but only abnormal results are displayed) Labs Reviewed  CBC WITH DIFFERENTIAL/PLATELET - Abnormal; Notable for the following components:      Result Value   Platelets 142 (*)    Lymphs Abs 4.1 (*)    All other components within normal limits  COMPREHENSIVE METABOLIC PANEL WITH GFR - Abnormal; Notable for the following components:   Calcium  8.5 (*)    Total Protein 6.4 (*)    All other components within normal limits  LIPASE, BLOOD  TROPONIN T, HIGH SENSITIVITY                                                                                                                          Radiology CT Angio Chest/Abd/Pel for Dissection W and/or W/WO Result Date: 04/21/2024 EXAM: CTA CHEST, ABDOMEN AND PELVIS WITH AND WITHOUT CONTRAST 04/21/2024 07:58:03 PM TECHNIQUE: CTA of the chest was performed with and without the administration of intravenous contrast. CTA of the abdomen and pelvis was performed with the administration of intravenous contrast. Multiplanar reformatted images are provided for review. MIP images are provided for review. Automated exposure control, iterative reconstruction, and/or weight based adjustment of the mA/kV was utilized to reduce the radiation dose to as low as reasonably achievable. 100 mL of iohexol  (OMNIPAQUE ) 350 MG/ML injection was administered intravenously. COMPARISON: PA and lateral chest today, lung cancer screening chest CT without contrast 09/08/2022, lung cancer screening chest CT without contrast 09/06/2021. CLINICAL HISTORY: Chest and epigastric pain with abdominal pain, acute aortic syndrome suspected. FINDINGS: VASCULATURE: AORTA: There is mild tortuosity and atherosclerosis of the  thoracic aorta. There is moderate to heavy nonstenosing calcific plaque in the abdominal aorta. There is no thoracic or abdominal aortic aneurysm or dissection, stenosis or penetrating ulcer. PULMONARY ARTERIES: The pulmonary arteries are adequately opacified. No arterial dilatation or embolus is seen. GREAT VESSELS OF AORTIC ARCH: The great vessels branch normally. There is nonstenosing calcific plaque at the right subclavian artery origin. The great vessels are otherwise unremarkable. No dissection. No arterial occlusion or significant stenosis. CELIAC TRUNK: No acute finding. No occlusion or significant stenosis. SUPERIOR MESENTERIC ARTERY: The superior mesenteric artery demonstrates mild calcific plaque inferiorly but no flow-limiting stenosis, aneurysm, or dissection. INFERIOR MESENTERIC ARTERY: No acute finding. No occlusion or significant stenosis. RENAL ARTERIES: Both renal arteries are single. Both are widely patent with mild calcific plaque in the proximal right renal artery. No occlusion or significant stenosis. ILIAC ARTERIES: There are mild to moderate calcified plaques in both common iliac and internal iliac arteries without stenosis, aneurysm, or dissection. The external iliac arteries are widely patent and plaque free except for minimal calcification in both vessels proximally. CHEST: MEDIASTINUM: The cardiac size is normal. There is no pericardial effusion. There are left main and moderate patchy  3-vessel coronary calcifications. There is a slightly prominent left internal mammary chain lymph node measuring 1.1 cm on axial 48 of series 6. This is unchanged. Scattered shotty subcentimeter mediastinal lymph nodes are also unchanged. No new adenopathy. LUNGS AND PLEURA: There are mild centrilobular and paraseptal emphysematous changes in the upper lobes. Diffuse bronchial thickening. There is a chronic stable 3 mm right upper lobe nodule medially series 7 Axial 47. No other nodules are identified.  There is no consolidation, effusion, or pneumothorax. Occasional linear scarlike opacities in both bases. THORACIC BONES AND SOFT TISSUES: There are mild degenerative changes of the thoracic spine. No acute or significant thoracic osseous findings. No acute soft tissue abnormality. ABDOMEN AND PELVIS: LIVER: The liver is mildly steatotic and measures 18 cm in length. There is no mass. GALLBLADDER AND BILE DUCTS: Gallbladder is unremarkable. No biliary ductal dilatation. SPLEEN: The spleen is mildly prominent, measuring 13.6 cm in length, without mass. PANCREAS: The pancreas is unremarkable. ADRENAL GLANDS: Bilateral adrenal glands demonstrate no acute abnormality. There is no adrenal mass. KIDNEYS, URETERS AND BLADDER: There is a complex cystic lesion arising from the posterior lower right kidney measuring 2.1 x 2.1 cm and 58 Hounsfield units, below the plane of the 2 prior chest CTs and was not seen on a CTA abdomen and pelvis of 02/23/2016. Findings are concerning for neoplasm. Nonemergent follow-up MRI without and with contrast is recommended. Both kidneys are otherwise unremarkable. There is a 2 mm nonobstructive calyceal stone in the inferior pole of the right kidney. There is chronic perinephric stranding. No hydronephrosis. No periureteral stranding. Urinary bladder is unremarkable. There is a mild impression into the bladder base from prostatomegaly. GI AND BOWEL: There is slight fluid distention in the stomach with normal wall thickness. Normal caliber small bowel without inflammatory changes. An appendix is not seen in this patient. There is left colonic diverticulosis without evidence of acute diverticulitis. There is no bowel obstruction. No abnormal bowel wall thickening or distension. REPRODUCTIVE: There is mild prostatomegaly with a transverse axis of 4.7 cm. PERITONEUM AND RETROPERITONEUM: No ascites or free air. There is a small left inguinal fat hernia. LYMPH NODES: No lymphadenopathy. ABDOMINAL  BONES AND SOFT TISSUES: There is advanced degenerative change at L4-L5 and L5-S1 with L5-S1 interbody ankylosis across the collapsed disc space. No acute or other significant osseous findings. No acute soft tissue abnormality. IMPRESSION: 1. No evidence of acute aortic syndrome, aneurysm or stenosis. Aortic atherosclerosis is seen, greatest in the abdominal segment . 2. No evidence of pulmonary arterial dilatation or embolism. 3. Branch vessel atherosclerosis but no flow-limiting stenoses in the branches. 4. Left main and 3-vessel coronary artery calcifications. 5. Complex cystic lesion in the posterior lower right kidney, concerning for neoplasm. Nonemergent follow-up MRI without and with contrast is recommended. 6. Prostatomegaly. . Electronically signed by: Francis Quam MD 04/21/2024 09:10 PM EST RP Workstation: [id number]   US  Abdomen Limited RUQ (LIVER/GB) Result Date: 04/21/2024 CLINICAL DATA:  Abdominal pain. EXAM: ULTRASOUND ABDOMEN LIMITED RIGHT UPPER QUADRANT COMPARISON:  None Available. FINDINGS: Gallbladder: No gallstones or wall thickening visualized (2.1 mm). No sonographic Murphy sign noted by sonographer. Common bile duct: Diameter: 3.1 mm Liver: The liver is enlarged and measures 18.5 cm in length. The left lobe of the liver is limited in evaluation secondary to the patient's body habitus. No focal lesion identified. Within normal limits in parenchymal echogenicity. Portal vein is patent on color Doppler imaging with normal direction of blood flow towards the liver. Other: None. IMPRESSION:  1. Hepatomegaly. 2. No evidence of cholelithiasis or acute cholecystitis. Electronically Signed   By: Suzen Dials M.D.   On: 04/21/2024 13:52   DG Chest 2 View Result Date: 04/21/2024 CLINICAL DATA:  Chest and epigastric pain.  Nausea. EXAM: CHEST - 2 VIEW COMPARISON:  None Available. FINDINGS: The heart size and mediastinal contours are within normal limits. Both lungs are clear. The visualized  skeletal structures are unremarkable. IMPRESSION: No active cardiopulmonary disease. Electronically Signed   By: Norleen DELENA Kil M.D.   On: 04/21/2024 11:30    Pertinent labs & imaging results that were available during my care of the patient were reviewed by me and considered in my medical decision making (see MDM for details).  Medications Ordered in ED Medications  alum & mag hydroxide-simeth (MAALOX/MYLANTA) 200-200-20 MG/5ML suspension 30 mL (30 mLs Oral Given 04/21/24 1759)  iohexol  (OMNIPAQUE ) 350 MG/ML injection 100 mL (100 mLs Intravenous Contrast Given 04/21/24 1958)                                                                                                                                     Procedures Procedures  (including critical care time)  Medical Decision Making / ED Course   MDM:  63 year old presenting to the emergency department with abdominal pain, back pain.  Patient overall well-appearing, physical examination with abdominal tenderness, otherwise reassuring.  Differential includes pancreatitis, cholecystitis, GERD, ACS, aortic process, obstruction, perforation.  Patient had overall reassuring testing so far including normal lipase, normal LFTs.  Right upper quadrant ultrasound was without evidence of cholecystitis or gallstones.  His lipase is negative but pancreatitis cannot be excluded so obtain imaging.  Given radiation of the back and chest pain will also consider dissection, suspect this is unlikely but given symptoms will check CTA chest and abdomen.  Will reassess.  Clinical Course as of 04/21/24 2325  Sun Apr 21, 2024  2324 CTA negative, incidental renal mass noted and d/w patient. Reports feeling well at this time. Distended stomach noted on CT but patient reports he ate immediately prior to CT scan. Will discharge patient to home. All questions answered. Patient comfortable with plan of discharge. Return precautions discussed with patient and specified  on the after visit summary.  [WS]    Clinical Course User Index [WS] Francesca Elsie CROME, MD     Additional history obtained: -Additional history obtained from spouse -External records from outside source obtained and reviewed including: Chart review including previous notes, labs, imaging, consultation notes including prior notes    Lab Tests: -I ordered, reviewed, and interpreted labs.   The pertinent results include:   Labs Reviewed  CBC WITH DIFFERENTIAL/PLATELET - Abnormal; Notable for the following components:      Result Value   Platelets 142 (*)    Lymphs Abs 4.1 (*)    All other components within normal limits  COMPREHENSIVE METABOLIC PANEL WITH GFR - Abnormal; Notable for  the following components:   Calcium  8.5 (*)    Total Protein 6.4 (*)    All other components within normal limits  LIPASE, BLOOD  TROPONIN T, HIGH SENSITIVITY    Notable for mild nonspecific thrombocytopenia   EKG   EKG Interpretation Date/Time:  Sunday April 21 2024 09:19:29 EST Ventricular Rate:  65 PR Interval:  150 QRS Duration:  82 QT Interval:  406 QTC Calculation: 422 R Axis:   48  Text Interpretation: Normal sinus rhythm Low voltage QRS Borderline ECG Confirmed by Francesca Fallow ([id number]) on 04/21/2024 5:32:36 PM         Imaging Studies ordered: I ordered imaging studies including CTA  On my interpretation imaging demonstrates no acute process I independently visualized and interpreted imaging. I agree with the radiologist interpretation   Medicines ordered and prescription drug management: Meds ordered this encounter  Medications   alum & mag hydroxide-simeth (MAALOX/MYLANTA) 200-200-20 MG/5ML suspension 30 mL   iohexol  (OMNIPAQUE ) 350 MG/ML injection 100 mL   ondansetron  (ZOFRAN -ODT) 4 MG disintegrating tablet    Sig: Take 1 tablet (4 mg total) by mouth every 8 (eight) hours as needed for nausea or vomiting.    Dispense:  20 tablet    Refill:  0    -I have  reviewed the patients home medicines and have made adjustments as needed   Social Determinants of Health:  Diagnosis or treatment significantly limited by social determinants of health: obesity   Reevaluation: After the interventions noted above, I reevaluated the patient and found that their symptoms have improved  Co morbidities that complicate the patient evaluation  Past Medical History:  Diagnosis Date   Coronary artery disease 05/09/2007   a.) MI 05/09/2007 in [place] --> 4.5 x 12 mm (pRCA) and 3.0 x 16 mm (dRCA) BMS x 2 placed ; b.) Stress myoview in 12/11 in [place]  (done for DOT physical) and per the patient was unremarkable; c.) Nuclear Stress Test (01/2012):  diaph atten, no ischemia, EF 56%.   Erectile dysfunction    a.) on PDE5i (sildenafil )   History of 2019 novel coronavirus disease (COVID-19) 05/27/2022   History of kidney stones    Hx of cardiovascular stress test    GXT (2/16): no ischemic EKG changes   Hyperlipidemia    Hypertension    Mild pulmonic regurgitation and RV dilation by prior echocardiogram    Dilated RV on 2009 echo.  However, echo (10/12) showed EF 55%, basal to mid inferior hypokinesis, grade II diastolic dysfunction, normal RV size and systolic function, PA systolic pressure;  Echocardiogram (01/28/11):  EF 55%, inf mild HK, Gr 2 DD, mild LAE, mild RAE   NSTEMI (non-ST elevated myocardial infarction) (HCC) 05/09/2007   a.) Tx'd in [facility] 05/09/2007 --> 30% prox posterolateral OM, 60% pRCA, occluded dRCA --> PTCA + aspiration thrombectomy + PCI (4.5 x 12 mm Liberta BMS pRCA and 3.0 x 16 mm Liberta BMS dRCA)   Obesity    OSA on CPAP    Secondary polycythemia    a.) smoking + obesity + OSAH; b.) JAK2 + EPO 03/2019 normal --> not polycythemia vera   Smoking       Dispostion: Disposition decision including need for hospitalization was considered, and patient discharged from emergency department.    Final Clinical Impression(s) / ED Diagnoses Final  diagnoses:  Abdominal pain, unspecified abdominal location  Renal mass of unknown nature     This chart was dictated using voice recognition software.  Despite best efforts to  proofread,  errors can occur which can change the documentation meaning.     [1]  Social History Tobacco Use   Smoking status: Every Day    Current packs/day: 0.50    Average packs/day: 0.5 packs/day for 35.0 years (17.5 ttl pk-yrs)    Types: Cigarettes   Smokeless tobacco: Never  Vaping Use   Vaping status: Never Used  Substance Use Topics   Alcohol use: Yes    Comment: 6pack a week when home   Drug use: No     Francesca Elsie CROME, MD 04/21/24 2325  "

## 2024-04-21 NOTE — ED Triage Notes (Signed)
 Patient reports woke up with upper abd pain going in to the back.  Hx MI

## 2024-04-21 NOTE — ED Provider Triage Note (Signed)
 Emergency Medicine Provider Triage Evaluation Note  Raymond Humphrey , a 63 y.o. male  was evaluated in triage.  Pt complains of epigastric abdominal pain x15 minutes. Now resolved. Patient now asymptomatic. Endorses some nausea when the pain occurred. Denies any recent fever, vomiting, diarrhea. Denies dysuria, hematuria.   Review of Systems  Positive:  Negative:   Physical Exam  BP 124/77   Pulse 62   Temp 97.7 F (36.5 C)   Resp 18   SpO2 100%  Gen:   Awake, no distress   Resp:  Normal effort  MSK:   Moves extremities without difficulty  Other:    Medical Decision Making  Medically screening exam initiated at 9:01 AM.  Appropriate orders placed.  Raymond Humphrey was informed that the remainder of the evaluation will be completed by another provider, this initial triage assessment does not replace that evaluation, and the importance of remaining in the ED until their evaluation is complete.     Hoy Nidia FALCON, [place] 04/21/24 [phone]

## 2024-05-09 DIAGNOSIS — N2889 Other specified disorders of kidney and ureter: Secondary | ICD-10-CM | POA: Insufficient documentation

## 2024-05-09 NOTE — Progress Notes (Signed)
 "  05/15/24 11:12 AM   Raymond Humphrey Jan 29, 1961 [id number]   HPI: 64 y.o. male here for initial evaluation of Right renal lesion  CT CAP (for AAA rule-out, 04/21/24) - incidental 2.1 cm RLP complex cystic lesion, ~58 HU  First visit today, accompanied by his wife Previously saw urologist for low T No prior kidney issues, GH, flank pain Current 30-40-pack-year smoker  Hx of CAD, prior MI x2 stents, nephrolithiasis, HTN, pulm regurgitation, obesity, OSA on CPAP, smoking      PMH: Past Medical History:  Diagnosis Date   Coronary artery disease 05/09/2007   a.) MI 05/09/2007 in [place] --> 4.5 x 12 mm (pRCA) and 3.0 x 16 mm (dRCA) BMS x 2 placed ; b.) Stress myoview in 12/11 in [place]  (done for DOT physical) and per the patient was unremarkable; c.) Nuclear Stress Test (01/2012):  diaph atten, no ischemia, EF 56%.   Erectile dysfunction    a.) on PDE5i (sildenafil )   History of 2019 novel coronavirus disease (COVID-19) 05/27/2022   History of kidney stones    Hx of cardiovascular stress test    GXT (2/16): no ischemic EKG changes   Hyperlipidemia    Hypertension    Mild pulmonic regurgitation and RV dilation by prior echocardiogram    Dilated RV on 2009 echo.  However, echo (10/12) showed EF 55%, basal to mid inferior hypokinesis, grade II diastolic dysfunction, normal RV size and systolic function, PA systolic pressure;  Echocardiogram (01/28/11):  EF 55%, inf mild HK, Gr 2 DD, mild LAE, mild RAE   NSTEMI (non-ST elevated myocardial infarction) (HCC) 05/09/2007   a.) Tx'd in [facility] 05/09/2007 --> 30% prox posterolateral OM, 60% pRCA, occluded dRCA --> PTCA + aspiration thrombectomy + PCI (4.5 x 12 mm Liberta BMS pRCA and 3.0 x 16 mm Liberta BMS dRCA)   Obesity    OSA on CPAP    Secondary polycythemia    a.) smoking + obesity + OSAH; b.) JAK2 + EPO 03/2019 normal --> not polycythemia vera   Smoking     Surgical History: Past Surgical History:  Procedure Laterality Date    APPENDECTOMY     CORONARY ANGIOPLASTY WITH STENT PLACEMENT Left 05/09/2007   Procedure: CORONARY ANGIOPLASTY WITH STENT PLACEMENT (BMS x 2); Location: [facility] [place], [place]); Surgeon: Garnette Legions, MD   CORONARY THROMBECTOMY  05/09/2007   Procedure: CORONARY THROMBECTOMY; Location: [facility] [place], [place]); Surgeon: Garnette Legions, MD   cosmetic skin removal     SHOULDER ARTHROSCOPY Right 09/01/2022   Procedure: ARTHROSCOPY SHOULDER;  Surgeon: Marchia Drivers, MD;  Location: [facility];  Service: Orthopedics;  Laterality: Right;   SHOULDER ARTHROSCOPY WITH OPEN ROTATOR CUFF REPAIR AND DISTAL CLAVICLE ACROMINECTOMY Right 09/01/2022   Procedure: SHOULDER ARTHROSCOPY WITH OPEN ROTATOR CUFF REPAIR AND DISTAL CLAVICLE ACROMINECTOMY;  Surgeon: Marchia Drivers, MD;  Location: [facility];  Service: Orthopedics;  Laterality: Right;    Family History: Family History  Problem Relation Age of Onset   Heart attack Father    Heart attack Cousin    Heart attack Maternal Grandfather     Social History:  reports that he has been smoking cigarettes. He has a 17.5 pack-year smoking history. He has never used smokeless tobacco. He reports current alcohol use. He reports that he does not use drugs.      Physical Exam: BP (!) 150/78   Pulse 72   Ht 5' 7 (1.702 m)   Wt 207 lb 3.2 oz (94 kg)  BMI 32.45 kg/m    Constitutional:  Alert and oriented, No acute distress. Cardiovascular: No clubbing, cyanosis, or edema. Respiratory: Normal respiratory effort, no increased work of breathing. GI: Nondistended Skin: No rashes, bruises or suspicious lesions. Neurologic: Grossly intact, no focal deficits, moving all 4 extremities. Psychiatric: Normal mood and affect.  Laboratory Data:  Latest Reference Range & Units 04/21/24 09:04  Creatinine 0.61 - 1.24 mg/dL 9.04     Pertinent Imaging: I have personally viewed and interpreted the CT CAP (for AAA rule-out, 04/21/24) - incidental  2.1 cm RLP complex cystic lesion, ~58 HU. No other renal masses. Bilateral kidneys morphologically normal in appearance otherwise. Normal appearing ureters and bladder, no hydro, no nephrolithiasis.    Assessment & Plan:    Renal mass Assessment & Plan: 2.1 cm RLP complex cyst (dx Dec 2025)   - cardiovascular comorbidity, current 20-pack year smoker  Reviewed his clinical history and recent CT imaging.  He appears to have a 2 cm right lower pole complex cyst, likely Bosniak 34F.  Reviewed the characteristics of this lesion with him today, possibility of underlying cystic renal cell cancer-although indeterminate at this size and current appearance.  Considering his cardiovascular comorbidity, I would not advocate for aggressive management.  Recommend interval MRI in 6 months for recharacterization, growth kinetics.   - MRI abd w/ and without contrast in 6 mo (~due July/Aug 2026) with follow up visit  Orders: -     MR ABDOMEN W WO CONTRAST; Future      Penne Skye, MD 05/15/2024  [facility] [address], [address] [place], [place] [address] [phone] "

## 2024-05-09 NOTE — Assessment & Plan Note (Addendum)
 2.1 cm RLP complex cyst (dx Dec 2025)   - cardiovascular comorbidity, current 20-pack year smoker  Reviewed his clinical history and recent CT imaging.  He appears to have a 2 cm right lower pole complex cyst, likely Bosniak 79F.  Reviewed the characteristics of this lesion with him today, possibility of underlying cystic renal cell cancer-although indeterminate at this size and current appearance.  Considering his cardiovascular comorbidity, I would not advocate for aggressive management.  Recommend interval MRI in 6 months for recharacterization, growth kinetics.   - MRI abd w/ and without contrast in 6 mo (~due July/Aug 2026) with follow up visit

## 2024-05-15 ENCOUNTER — Encounter: Payer: Self-pay | Admitting: Urology

## 2024-05-15 ENCOUNTER — Ambulatory Visit: Payer: Self-pay | Admitting: Urology

## 2024-05-15 VITALS — BP 150/78 | HR 72 | Ht 67.0 in | Wt 207.2 lb

## 2024-05-15 DIAGNOSIS — N2889 Other specified disorders of kidney and ureter: Secondary | ICD-10-CM

## 2024-05-15 NOTE — Progress Notes (Signed)
 Raymond Humphrey presents for an office visit. BP today is 150/78. Greater than 140/90. Provider  notified. Pt advised to follow up with PCP if symptoms continue . Pt voiced understanding.
